# Patient Record
Sex: Male | Born: 1975 | Race: White | Hispanic: No | Marital: Married | Smoking: Never smoker
Health system: Southern US, Community
[De-identification: ages and names within clinical notes are randomized; demographics above are authoritative.]

## PROBLEM LIST (undated history)

## (undated) DIAGNOSIS — E349 Endocrine disorder, unspecified: Secondary | ICD-10-CM

## (undated) HISTORY — PX: OTHER SURGICAL HISTORY: SHX169

---

## 2016-07-06 ENCOUNTER — Other Ambulatory Visit (HOSPITAL_BASED_OUTPATIENT_CLINIC_OR_DEPARTMENT_OTHER): Payer: Self-pay

## 2016-07-06 DIAGNOSIS — R5383 Other fatigue: Secondary | ICD-10-CM

## 2016-07-06 DIAGNOSIS — R0683 Snoring: Secondary | ICD-10-CM

## 2016-07-06 DIAGNOSIS — R635 Abnormal weight gain: Secondary | ICD-10-CM

## 2016-07-06 DIAGNOSIS — G4710 Hypersomnia, unspecified: Secondary | ICD-10-CM

## 2016-08-04 ENCOUNTER — Encounter (HOSPITAL_BASED_OUTPATIENT_CLINIC_OR_DEPARTMENT_OTHER): Payer: Self-pay

## 2016-09-05 ENCOUNTER — Ambulatory Visit (HOSPITAL_BASED_OUTPATIENT_CLINIC_OR_DEPARTMENT_OTHER): Payer: BC Managed Care – PPO | Attending: Emergency Medicine | Admitting: Internal Medicine

## 2016-09-05 DIAGNOSIS — G4733 Obstructive sleep apnea (adult) (pediatric): Secondary | ICD-10-CM | POA: Diagnosis not present

## 2016-09-05 DIAGNOSIS — R0683 Snoring: Secondary | ICD-10-CM | POA: Diagnosis present

## 2016-09-05 DIAGNOSIS — R5383 Other fatigue: Secondary | ICD-10-CM | POA: Insufficient documentation

## 2016-09-05 DIAGNOSIS — R635 Abnormal weight gain: Secondary | ICD-10-CM | POA: Insufficient documentation

## 2016-09-05 DIAGNOSIS — G4710 Hypersomnia, unspecified: Secondary | ICD-10-CM

## 2016-09-09 DIAGNOSIS — R0683 Snoring: Secondary | ICD-10-CM

## 2016-09-09 NOTE — Procedures (Signed)
  Patient Name: Allen Noble, [name] Study Date: 09/05/2016 Gender: Male D.O.B: Mar 12, 1975 Age (years): 40 Referring Provider: Idalia Needleiane Miller Height (inches): 72 Interpreting Physician: Jetty Duhamellinton Lovell Nuttall MD, ABSM Weight (lbs): 285 RPSGT: Wylie HailDavis, Rico BMI: 39 MRN: [id number] Neck Size: 18.00 CLINICAL INFORMATION Sleep Study Type: NPSG  Indication for sleep study: Fatigue, Snoring  Epworth Sleepiness Score: 14  SLEEP STUDY TECHNIQUE As per the AASM Manual for the Scoring of Sleep and Associated Events v2.3 (April 2016) with a hypopnea requiring 4% desaturations.  The channels recorded and monitored were frontal, central and occipital EEG, electrooculogram (EOG), submentalis EMG (chin), nasal and oral airflow, thoracic and abdominal wall motion, anterior tibialis EMG, snore microphone, electrocardiogram, and pulse oximetry.  MEDICATIONS Medications self-administered by patient taken the night of the study : none reported  SLEEP ARCHITECTURE The study was initiated at 10:35:54 PM and ended at 4:38:10 AM.  Sleep onset time was 3.6 minutes and the sleep efficiency was 93.6%. The total sleep time was 339.2 minutes.  Stage REM latency was 160.5 minutes.  The patient spent 10.17% of the night in stage N1 sleep, 71.34% in stage N2 sleep, 6.49% in stage N3 and 12.00% in REM.  Alpha intrusion was absent.  Supine sleep was 47.96%.  RESPIRATORY PARAMETERS The overall apnea/hypopnea index (AHI) was 6.0 per hour. There were 15 total apneas, including 14 obstructive, 1 central and 0 mixed apneas. There were 19 hypopneas and 23 RERAs.  The AHI during Stage REM sleep was 35.4 per hour.  AHI while supine was 12.2 per hour.  The mean oxygen saturation was 93.49%. The minimum SpO2 during sleep was 82.00%.  Loud snoring was noted during this study.  CARDIAC DATA The 2 lead EKG demonstrated sinus rhythm. The mean heart rate was 58.98 beats per minute. Other EKG findings include: None.  LEG  MOVEMENT DATA The total PLMS were 0 with a resulting PLMS index of 0.00. Associated arousal with leg movement index was 0.0 .  IMPRESSIONS - Mild obstructive sleep apnea occurred during this study (AHI = 6.0/h). REM AHI 35.4/ hr. - No significant central sleep apnea occurred during this study (CAI = 0.2/h). - Mild oxygen desaturation was noted during this study (Min O2 = 82.00%, Mean 93.5%). - The patient snored with Loud snoring volume. - No cardiac abnormalities were noted during this study. - Clinically significant periodic limb movements did not occur during sleep. No significant associated arousals.  DIAGNOSIS - Obstructive Sleep Apnea (327.23 [G47.33 ICD-10])  RECOMMENDATIONS - Minimal obstructive sleep apnea may respond adequately to symptomatic therapy directed at snoring. Consider chin strap, oral appliance, management of nasal congestion. - Avoid alcohol, sedatives and other CNS depressants that may worsen sleep apnea and disrupt normal sleep architecture. - Sleep hygiene should be reviewed to assess factors that may improve sleep quality. - Weight management and regular exercise should be initiated or continued if appropriate.  [Electronically signed] 09/09/2016 08:38 PM  Jetty Duhamellinton Jametta Moorehead MD, ABSM Diplomate, American Board of Sleep Medicine   NPI: [id number]  Waymon BudgeYOUNG,Leshonda Galambos D Diplomate, American Board of Sleep Medicine  ELECTRONICALLY SIGNED ON:  09/09/2016, 8:33 PM [facility] PH: [phone]   FX: [phone] ACCREDITED BY THE AMERICAN ACADEMY OF SLEEP MEDICINE

## 2016-11-16 ENCOUNTER — Encounter: Payer: Self-pay | Admitting: Family Medicine

## 2016-11-16 ENCOUNTER — Ambulatory Visit (INDEPENDENT_AMBULATORY_CARE_PROVIDER_SITE_OTHER): Payer: BC Managed Care – PPO | Admitting: Family Medicine

## 2016-11-16 DIAGNOSIS — M25551 Pain in right hip: Secondary | ICD-10-CM

## 2016-11-16 MED ORDER — PREDNISONE 10 MG PO TABS: ORAL_TABLET | ORAL | 0 refills | Status: DC

## 2016-11-16 NOTE — Patient Instructions (Addendum)
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  Try to avoid painful activities when possible. Pick 2-3 stretches where you feel the pull in the area of pain - do 3 of these and hold for 20-30 seconds twice a day (ok to wait a couple days to start these if too painful). Prednisone dose pack as directed for 6 days. Do not take meloxicam(mobic) while on prednisone but ok to restart this the day after you finish the prednisone. Ok to take tramadol and flexeril as needed in addition to the prednisone. Consider physical therapy if improving, imaging of your lumbar spine if not improving. Call me in a week to let me know how you're doing.

## 2016-11-17 ENCOUNTER — Telehealth: Payer: Self-pay | Admitting: Family Medicine

## 2016-11-17 MED ORDER — OXYCODONE-ACETAMINOPHEN 5-325 MG PO TABS: 1.0000 | ORAL_TABLET | ORAL | 0 refills | Status: DC | PRN

## 2016-11-17 NOTE — Telephone Encounter (Signed)
I would expect him to be turning the corner in the next 24 hours.  We could try something stronger than tramadol like hydrocodone or oxycodone if he would like - please let me know.  If he still struggles despite the medications we will go ahead with MRI.  If he is really hurting over the weekend even with medications (including the stronger pain medicine) I'd encourage him to go to the emergency department.  Regardless I want him to call me again on Monday to let me know how he's doing.

## 2016-11-17 NOTE — Telephone Encounter (Signed)
Ok I sent oxycodone to his pharmacy.  Thanks!

## 2016-11-17 NOTE — Telephone Encounter (Signed)
Patient's wife calling with concerns. States patient is in extreme pain and can hardly sit or sleep.   Wants to know if this is normal for his dx and how long it takes for prednisone to start relieving pain. States at [facility] they prescribed him Tramadol and Flexeril for pain. The Tramadol did not relieve pain so patient started taking Ibuprofen which did help. Patient states he was instructed not to take Ibuprofen with Prednisone. Wants to know how he can get some relief

## 2016-11-17 NOTE — Telephone Encounter (Signed)
Informed patient and patient's wife. He would like a stronger medication to help with the pain.

## 2016-11-19 ENCOUNTER — Emergency Department (HOSPITAL_COMMUNITY)
Admission: EM | Admit: 2016-11-19 | Discharge: 2016-11-19 | Disposition: A | Discharge status: Home / Self Care | Payer: BC Managed Care – PPO | Attending: Emergency Medicine | Admitting: Emergency Medicine

## 2016-11-19 ENCOUNTER — Encounter (HOSPITAL_COMMUNITY): Payer: Self-pay

## 2016-11-19 ENCOUNTER — Other Ambulatory Visit: Payer: Self-pay

## 2016-11-19 DIAGNOSIS — M545 Low back pain: Secondary | ICD-10-CM | POA: Diagnosis present

## 2016-11-19 DIAGNOSIS — M5410 Radiculopathy, site unspecified: Secondary | ICD-10-CM

## 2016-11-19 MED ORDER — DIAZEPAM 5 MG/ML IJ SOLN
5.0000 mg | Freq: Once | INTRAMUSCULAR | Status: AC
  Administered 2016-11-19: 5 mg via INTRAMUSCULAR
  Filled 2016-11-19: qty 2

## 2016-11-19 MED ORDER — KETOROLAC TROMETHAMINE 30 MG/ML IJ SOLN
30.0000 mg | Freq: Once | INTRAMUSCULAR | Status: AC
  Administered 2016-11-19: 30 mg via INTRAMUSCULAR
  Filled 2016-11-19: qty 1

## 2016-11-19 NOTE — ED Triage Notes (Signed)
Pt states that he hurt his hip on Tuesday, seen at [facility] and diagnosed with sciatica, pain meds and muscle relaxer not relieving pain. Pain to R hip.

## 2016-11-19 NOTE — ED Notes (Signed)
Pt understood dc material. NAD Noted 

## 2016-11-19 NOTE — ED Provider Notes (Signed)
[facility] Provider Note   CSN: [id number] Arrival date & time: 11/19/16  [id number]     History   Chief Complaint Chief Complaint  Patient presents with  . Sciatica    HPI Allen Noble is a 41 y.o. male.  Patient presents to the ED with a chief complaint of low back and leg pain.  He states he was lifting weights and doing squats last week and began having pain in his leg and back.  He was seen by sports medicine, and was prescribed pain meds, muscle relaxants and prednisone for sciatica.  He denies any bowel or bladder incontinence, saddle anesthesia, weakness, numbness, or tingling.  He states that the pain is worsened with hip flexion.  He denies any other associated symptoms.   The history is provided by the patient. No language interpreter was used.    History reviewed. No pertinent past medical history.  There are no active problems to display for this patient.   History reviewed. No pertinent surgical history.     Home Medications    Prior to Admission medications   Medication Sig Start Date End Date Taking? Authorizing Provider  oxyCODONE-acetaminophen (PERCOCET/ROXICET) 5-325 MG tablet Take 1 tablet every 4 (four) hours as needed by mouth for severe pain. 11/17/16   Hudnall, Azucena FallenShane R, MD  predniSONE (DELTASONE) 10 MG tablet 6 tabs po day 1, 5 tabs po day 2, 4 tabs po day 3, 3 tabs po day 4, 2 tabs po day 5, 1 tab po day 6 11/16/16   Hudnall, Azucena FallenShane R, MD    Family History No family history on file.  Social History Social History   Tobacco Use  . Smoking status: Never Smoker  . Smokeless tobacco: Never Used  Substance Use Topics  . Alcohol use: No    Frequency: Never  . Drug use: No     Allergies   Patient has no known allergies.   Review of Systems Review of Systems  All other systems reviewed and are negative.    Physical Exam Updated Vital Signs BP (!) 138/97 (BP Location: Right Arm)   Pulse 99  Comment: Simultaneous filing. User may not have seen previous data.  Temp 97.7 F (36.5 C) (Oral)   Resp 17   SpO2 96% Comment: Simultaneous filing. User may not have seen previous data.  Physical Exam Physical Exam  Constitutional: Pt appears well-developed and well-nourished. No distress.  HENT:  Head: Normocephalic and atraumatic.  Mouth/Throat: Oropharynx is clear and moist. No oropharyngeal exudate.  Eyes: Conjunctivae are normal.  Neck: Normal range of motion. Neck supple.  No meningismus Cardiovascular: Normal rate, regular rhythm and intact distal pulses.   Pulmonary/Chest: Effort normal and breath sounds normal. No respiratory distress. Pt has no wheezes.  Abdominal: Pt exhibits no distension Musculoskeletal:  Mild right lumbar paraspinal muscle tenderness, no bony CTLS spine tenderness, deformity, step-off, or crepitus Lymphadenopathy: Pt has no cervical adenopathy.  Neurological: Pt is alert and oriented Speech is clear and goal oriented, follows commands Normal 5/5 strength in upper and lower extremities bilaterally including dorsiflexion and plantar flexion, strong and equal grip strength Sensation intact Moves extremities without ataxia, coordination intactl  Normal gait Normal balance No Clonus Skin: Skin is warm and dry. No rash noted. Pt is not diaphoretic. No erythema.  Psychiatric: Pt has a normal mood and affect. Behavior is normal.  Nursing note and vitals reviewed.   ED Treatments / Results  Labs (all labs ordered are  listed, but only abnormal results are displayed) Labs Reviewed - No data to display  EKG  EKG Interpretation None       Radiology No results found.  Procedures Procedures (including critical care time)  Medications Ordered in ED Medications  ketorolac (TORADOL) 30 MG/ML injection 30 mg (not administered)  diazepam (VALIUM) injection 5 mg (not administered)     Initial Impression / Assessment and Plan / ED Course  I have  reviewed the triage vital signs and the nursing notes.  Pertinent labs & imaging results that were available during my care of the patient were reviewed by me and considered in my medical decision making (see chart for details).     Patient with back pain.    No neurological deficits and normal neuro exam.  Patient is ambulatory.  No loss of bowel or bladder control.  Doubt cauda equina.  Denies fever,  doubt epidural abscess or other lesion. Recommend back exercises, stretching, RICE, and will recommend continuing therapy.  Consultants: none  Encouraged the patient that there could be a need for additional workup and/or imaging such as MRI, if the symptoms do not resolve. Patient advised that if the back pain does not resolve, or radiates, this could progress to more serious conditions and is encouraged to follow-up with PCP or orthopedics within 2 weeks.     Final Clinical Impressions(s) / ED Diagnoses   Final diagnoses:  Radicular pain of right lower extremity    ED Discharge Orders    None       Roxy HorsemanBrowning, Ileene Allie, PA-C 11/19/16 Ok [id number]    Wickline, Donald, MD 11/19/16 [phone]

## 2016-11-20 NOTE — Telephone Encounter (Signed)
Patient was informed on 11/16 of medication being sent to pharmacy

## 2016-11-21 ENCOUNTER — Encounter: Payer: Self-pay | Admitting: Family Medicine

## 2016-11-21 DIAGNOSIS — M25551 Pain in right hip: Secondary | ICD-10-CM | POA: Insufficient documentation

## 2016-11-21 NOTE — Progress Notes (Signed)
PCP: Jarrett SohoWharton, Courtney, PA-C  Subjective:   HPI: Patient is a 41 y.o. male here for right back/leg pain.  Patient reports he's had worsening right hip/leg pain for about 2 days now. Pain level is 5/10, feels like a cramp that goes down his right leg. Worse with prolonged sitting. Goes down leg laterally to about the knee. No numbness or tingling. No bowel/bladder dysfunction. Tried flexeril, tramadol, meloxicam from urgent care. Using heating pad also.  History reviewed. No pertinent past medical history.  Current Outpatient Medications on File Prior to Visit  Medication Sig Dispense Refill  . anastrozole (ARIMIDEX) 1 MG tablet Take 1 mg by mouth daily.  3  . PARoxetine (PAXIL) 10 MG tablet Take 10 mg by mouth daily.  3  . Testosterone 20.25 MG/ACT (1.62%) GEL APP 1 PUMP TO EACH UPPER ARM AND SHOULDER QAM UTD  3   No current facility-administered medications on file prior to visit.     History reviewed. No pertinent surgical history.  No Known Allergies  Social History   Socioeconomic History  . Marital status: Married    Spouse name: Not on file  . Number of children: Not on file  . Years of education: Not on file  . Highest education level: Not on file  Social Needs  . Financial resource strain: Not on file  . Food insecurity - worry: Not on file  . Food insecurity - inability: Not on file  . Transportation needs - medical: Not on file  . Transportation needs - non-medical: Not on file  Occupational History  . Not on file  Tobacco Use  . Smoking status: Never Smoker  . Smokeless tobacco: Never Used  Substance and Sexual Activity  . Alcohol use: No    Frequency: Never  . Drug use: No  . Sexual activity: Not on file  Other Topics Concern  . Not on file  Social History Narrative  . Not on file    History reviewed. No pertinent family history.  BP (!) 143/89   Pulse (!) 105   Ht 6' (1.829 m)   Wt 290 lb (131.5 kg)   BMI 39.33 kg/m   Review of  Systems: See HPI above.     Objective:  Physical Exam:  Gen: NAD, comfortable in exam room  Back: No gross deformity, scoliosis. TTP right hip over external rotators.  No midline or bony TTP. FROM. Strength LEs 5/5 all muscle groups.   1+ MSRs in patellar and achilles tendons, equal bilaterally. Negative SLRs. Sensation intact to light touch bilaterally.  Right hip: No gross deformity. TTP over hip external rotators.  No other tenderness. 5/5 strength all motions. Negative logroll bilateral hips Negative fabers and piriformis stretches. NVI distally.  Assessment & Plan:  1. Right hip pain - consistent with piriformis syndrome with sciatica.  Discussed possibility of disc herniation causing similar symptoms as well.  Shown home exercises and stretches to do.  Start prednisone dose pack then transition to meloxicam.  Tramadol and flexeril if needed.  Consider physical therapy, MRI lumbar spine if not improving.  Call us in a week with an update on his status.

## 2016-11-21 NOTE — Assessment & Plan Note (Signed)
consistent with piriformis syndrome with sciatica.  Discussed possibility of disc herniation causing similar symptoms as well.  Shown home exercises and stretches to do.  Start prednisone dose pack then transition to meloxicam.  Tramadol and flexeril if needed.  Consider physical therapy, MRI lumbar spine if not improving.  Call us in a week with an update on his status.

## 2016-11-22 ENCOUNTER — Ambulatory Visit: Payer: BC Managed Care – PPO | Admitting: Family Medicine

## 2016-12-09 ENCOUNTER — Encounter (HOSPITAL_COMMUNITY): Payer: Self-pay | Admitting: Emergency Medicine

## 2016-12-09 ENCOUNTER — Emergency Department (HOSPITAL_COMMUNITY)
Admission: EM | Admit: 2016-12-09 | Discharge: 2016-12-09 | Disposition: A | Discharge status: Home / Self Care | Payer: BC Managed Care – PPO | Attending: Emergency Medicine | Admitting: Emergency Medicine

## 2016-12-09 ENCOUNTER — Emergency Department (HOSPITAL_COMMUNITY): Payer: BC Managed Care – PPO

## 2016-12-09 DIAGNOSIS — N200 Calculus of kidney: Secondary | ICD-10-CM

## 2016-12-09 DIAGNOSIS — R1084 Generalized abdominal pain: Secondary | ICD-10-CM | POA: Diagnosis present

## 2016-12-09 DIAGNOSIS — Z79899 Other long term (current) drug therapy: Secondary | ICD-10-CM | POA: Insufficient documentation

## 2016-12-09 LAB — URINALYSIS, ROUTINE W REFLEX MICROSCOPIC
BILIRUBIN URINE: NEGATIVE
GLUCOSE, UA: NEGATIVE mg/dL
KETONES UR: NEGATIVE mg/dL
LEUKOCYTES UA: NEGATIVE
NITRITE: NEGATIVE
PROTEIN: NEGATIVE mg/dL
Specific Gravity, Urine: 1.019 (ref 1.005–1.030)
Squamous Epithelial / LPF: NONE SEEN
pH: 6 (ref 5.0–8.0)

## 2016-12-09 LAB — BASIC METABOLIC PANEL
Anion gap: 11 (ref 5–15)
BUN: 19 mg/dL (ref 6–20)
CALCIUM: 8.9 mg/dL (ref 8.9–10.3)
CO2: 19 mmol/L — ABNORMAL LOW (ref 22–32)
CREATININE: 1.1 mg/dL (ref 0.61–1.24)
Chloride: 105 mmol/L (ref 101–111)
GFR calc Af Amer: >60 mL/min (ref >60)
GLUCOSE: 107 mg/dL — AB (ref 65–99)
Potassium: 4.9 mmol/L (ref 3.5–5.1)
SODIUM: 135 mmol/L (ref 135–145)

## 2016-12-09 LAB — CBC
HEMATOCRIT: 44.2 % (ref 39.0–52.0)
Hemoglobin: 15.2 g/dL (ref 13.0–17.0)
MCH: 30.4 pg (ref 26.0–34.0)
MCHC: 34.4 g/dL (ref 30.0–36.0)
MCV: 88.4 fL (ref 78.0–100.0)
PLATELETS: 163 10*3/uL (ref 150–400)
RBC: 5 MIL/uL (ref 4.22–5.81)
RDW: 13.4 % (ref 11.5–15.5)
WBC: 12 10*3/uL — AB (ref 4.0–10.5)

## 2016-12-09 MED ORDER — TAMSULOSIN HCL 0.4 MG PO CAPS
0.4000 mg | ORAL_CAPSULE | Freq: Once | ORAL | Status: AC
  Administered 2016-12-09: 0.4 mg via ORAL
  Filled 2016-12-09: qty 1

## 2016-12-09 MED ORDER — OXYCODONE-ACETAMINOPHEN 5-325 MG PO TABS: 2.0000 | ORAL_TABLET | ORAL | 0 refills | Status: DC | PRN

## 2016-12-09 MED ORDER — TAMSULOSIN HCL 0.4 MG PO CAPS: 0.4000 mg | ORAL_CAPSULE | Freq: Every day | ORAL | 0 refills | Status: DC

## 2016-12-09 MED ORDER — KETOROLAC TROMETHAMINE 30 MG/ML IJ SOLN
30.0000 mg | Freq: Once | INTRAMUSCULAR | Status: AC
  Administered 2016-12-09: 30 mg via INTRAVENOUS
  Filled 2016-12-09: qty 1

## 2016-12-09 MED ORDER — OXYCODONE-ACETAMINOPHEN 5-325 MG PO TABS
1.0000 | ORAL_TABLET | Freq: Once | ORAL | Status: AC
  Administered 2016-12-09: 1 via ORAL
  Filled 2016-12-09: qty 1

## 2016-12-09 MED ORDER — HYDROMORPHONE HCL 1 MG/ML IJ SOLN: 1.0000 mg | INTRAMUSCULAR | Status: DC | PRN

## 2016-12-09 MED ORDER — PROMETHAZINE HCL 25 MG PO TABS: 25.0000 mg | ORAL_TABLET | Freq: Four times a day (QID) | ORAL | 0 refills | Status: DC | PRN

## 2016-12-09 MED ORDER — ONDANSETRON HCL 4 MG/2ML IJ SOLN
4.0000 mg | Freq: Once | INTRAMUSCULAR | Status: AC
  Administered 2016-12-09: 4 mg via INTRAVENOUS
  Filled 2016-12-09: qty 2

## 2016-12-09 NOTE — ED Notes (Signed)
Given strainer, and urine cup incase he passes stone.

## 2016-12-09 NOTE — ED Provider Notes (Signed)
[facility] EMERGENC[facility] Provider Note   CSN: [id number] Arrival date & time: 12/09/16  1037     History   Chief Complaint Chief Complaint  Patient presents with  . Flank Pain    HPI Allen Noble is a 41 y.o. male. Chief complaint is abdominal, and flank pain.  HPI: Allen Noble is a 41 year old male with no past history of kidney stones. He had a sudden onset of left flank and mid abdominal pain last night. Episode of nausea and vomiting 1. He became sweaty at home with recurrence of his symptoms this morning and has had recurrent episodes of colic keep in the bone area since last night. His urine was dark yesterday morning. No history of gout, or kidney stones for here family members. No fevers.  History reviewed. No pertinent past medical history.  Patient Active Problem List   Diagnosis Date Noted  . Right hip pain 11/21/2016    No past surgical history on file.     Home Medications    Prior to Admission medications   Medication Sig Start Date End Date Taking? Authorizing Provider  anastrozole (ARIMIDEX) 1 MG tablet Take 1 mg by mouth daily. 10/01/16   [provider]  oxyCODONE-acetaminophen (PERCOCET/ROXICET) 5-325 MG tablet Take 2 tablets by mouth every 4 (four) hours as needed. 12/09/16   Rolland PorterJames, Dvante Hands, MD  PARoxetine (PAXIL) 10 MG tablet Take 10 mg by mouth daily. 09/28/16   [provider]  predniSONE (DELTASONE) 10 MG tablet 6 tabs po day 1, 5 tabs po day 2, 4 tabs po day 3, 3 tabs po day 4, 2 tabs po day 5, 1 tab po day 6 11/16/16   Hudnall, Azucena FallenShane R, MD  promethazine (PHENERGAN) 25 MG tablet Take 1 tablet (25 mg total) by mouth every 6 (six) hours as needed for nausea or vomiting. 12/09/16   Rolland PorterJames, Javen Ridings, MD  tamsulosin (FLOMAX) 0.4 MG CAPS capsule Take 1 capsule (0.4 mg total) by mouth daily. 12/09/16   Rolland PorterJames, Eddye Broxterman, MD  Testosterone 20.25 MG/ACT (1.62%) GEL APP 1 PUMP TO EACH UPPER ARM AND SHOULDER QAM UTD 11/13/16   [provider]    Family History No family history on file.  Social History Social History   Tobacco Use  . Smoking status: Never Smoker  . Smokeless tobacco: Never Used  Substance Use Topics  . Alcohol use: No    Frequency: Never  . Drug use: No     Allergies   Patient has no known allergies.   Review of Systems Review of Systems  Constitutional: Negative for appetite change, chills, diaphoresis, fatigue and fever.  HENT: Negative for mouth sores, sore throat and trouble swallowing.   Eyes: Negative for visual disturbance.  Respiratory: Negative for cough, chest tightness, shortness of breath and wheezing.   Cardiovascular: Negative for chest pain.  Gastrointestinal: Positive for abdominal pain and diarrhea. Negative for abdominal distention, nausea and vomiting.  Endocrine: Negative for polydipsia, polyphagia and polyuria.  Genitourinary: Positive for flank pain. Negative for dysuria, frequency and hematuria.  Musculoskeletal: Negative for gait problem.  Skin: Negative for color change, pallor and rash.  Neurological: Negative for dizziness, syncope, light-headedness and headaches.  Hematological: Does not bruise/bleed easily.  Psychiatric/Behavioral: Negative for behavioral problems and confusion.     Physical Exam Updated Vital Signs BP (!) 150/90   Pulse 82   Temp 98.3 F (36.8 C) (Oral)   Resp 18   SpO2 95%   Physical Exam  Constitutional:  He is oriented to person, place, and time. He appears well-developed and well-nourished. No distress.  Patient pacing. Holding his left flank  HENT:  Head: Normocephalic.  Eyes: Conjunctivae are normal. Pupils are equal, round, and reactive to light. No scleral icterus.  Neck: Normal range of motion. Neck supple. No thyromegaly present.  Cardiovascular: Normal rate and regular rhythm. Exam reveals no gallop and no friction rub.  No murmur heard. Pulmonary/Chest: Effort normal and breath sounds normal. No respiratory  distress. He has no wheezes. He has no rales.  Abdominal: Soft. Bowel sounds are normal. He exhibits no distension. There is no tenderness. There is no rebound.  Soft benign abdomen. No reproducible tenderness.  Musculoskeletal: Normal range of motion.  Neurological: He is alert and oriented to person, place, and time.  Skin: Skin is warm and dry. No rash noted.  Psychiatric: He has a normal mood and affect. His behavior is normal.     ED Treatments / Results  Labs (all labs ordered are listed, but only abnormal results are displayed) Labs Reviewed  URINALYSIS, ROUTINE W REFLEX MICROSCOPIC - Abnormal; Notable for the following components:      Result Value   Hgb urine dipstick LARGE (*)    Bacteria, UA RARE (*)    All other components within normal limits  BASIC METABOLIC PANEL - Abnormal; Notable for the following components:   CO2 19 (*)    Glucose, Bld 107 (*)    All other components within normal limits  CBC - Abnormal; Notable for the following components:   WBC 12.0 (*)    All other components within normal limits  CBC    EKG  EKG Interpretation None       Radiology No results found.  Procedures Procedures (including critical care time)  Medications Ordered in ED Medications  HYDROmorphone (DILAUDID) injection 1 mg (not administered)  oxyCODONE-acetaminophen (PERCOCET/ROXICET) 5-325 MG per tablet 1 tablet (not administered)  ketorolac (TORADOL) 30 MG/ML injection 30 mg (30 mg Intravenous Given 12/09/16 1159)  ondansetron (ZOFRAN) injection 4 mg (4 mg Intravenous Given 12/09/16 1157)  tamsulosin (FLOMAX) capsule 0.4 mg (0.4 mg Oral Given 12/09/16 1200)     Initial Impression / Assessment and Plan / ED Course  I have reviewed the triage vital signs and the nursing notes.  Pertinent labs & imaging results that were available during my care of the patient were reviewed by me and considered in my medical decision making (see chart for details).  Clinical Course  as of Dec 09 1320  Sat Dec 09, 2016  1321 CBC [MJ]    Clinical Course User Index [MJ] Rolland PorterJames, Caddie Randle, MD   CT scan shows left mid ureteral stone with hydronephrosis. Urine shows blood but no infection. His renal function is intact. His given Toradol, Flomax, Zofran, and IV fluids and feeling much improved. Given by mouth Percocet 1. Discharge home. Daily Flomax.  Anti-inflammatories. When necessary oxycodone. When necessary Phenergan. Urology follow-up if not improving. Return precautions including intolerance to pain, refractory vomiting, fever.  Final Clinical Impressions(s) / ED Diagnoses   Final diagnoses:  Kidney stone    ED Discharge Orders        Ordered    oxyCODONE-acetaminophen (PERCOCET/ROXICET) 5-325 MG tablet  Every 4 hours PRN     12/09/16 1319    promethazine (PHENERGAN) 25 MG tablet  Every 6 hours PRN     12/09/16 1319    tamsulosin (FLOMAX) 0.4 MG CAPS capsule  Daily  12/09/16 1319       Rolland Porter, MD 12/09/16 1325

## 2016-12-09 NOTE — ED Notes (Signed)
Patient transported to CT 

## 2016-12-09 NOTE — Discharge Instructions (Signed)
Push fluids. Continue naproxen twice per day, or ibuprofen 3 times per day. Phenergan as needed for nausea. Percocet/oxycodone prescription as needed for pain not improving with above. Flomax once per day to expedite passage of stone Contact [facility] for 5 days if your symptoms have not improved/resolved. Return to ER with intolerance of symptoms, refractory vomiting or pain, fevers.

## 2016-12-09 NOTE — ED Triage Notes (Signed)
Pt diaphoretic. States new onset left sided abdominal/flank pain starting yesterday with nausea.

## 2017-02-01 ENCOUNTER — Encounter: Payer: Self-pay | Admitting: Family Medicine

## 2017-02-01 ENCOUNTER — Ambulatory Visit: Payer: BC Managed Care – PPO | Admitting: Family Medicine

## 2017-02-01 ENCOUNTER — Ambulatory Visit (HOSPITAL_BASED_OUTPATIENT_CLINIC_OR_DEPARTMENT_OTHER)
Admission: RE | Admit: 2017-02-01 | Discharge: 2017-02-01 | Disposition: A | Discharge status: Home / Self Care | Payer: BC Managed Care – PPO | Source: Ambulatory Visit | Attending: Family Medicine | Admitting: Family Medicine

## 2017-02-01 VITALS — BP 147/91 | HR 68 | Ht 72.0 in | Wt 282.0 lb

## 2017-02-01 DIAGNOSIS — M79604 Pain in right leg: Secondary | ICD-10-CM

## 2017-02-01 DIAGNOSIS — M25551 Pain in right hip: Secondary | ICD-10-CM

## 2017-02-01 DIAGNOSIS — M48061 Spinal stenosis, lumbar region without neurogenic claudication: Secondary | ICD-10-CM | POA: Diagnosis not present

## 2017-02-01 DIAGNOSIS — M545 Low back pain: Secondary | ICD-10-CM

## 2017-02-01 DIAGNOSIS — M5136 Other intervertebral disc degeneration, lumbar region: Secondary | ICD-10-CM | POA: Insufficient documentation

## 2017-02-01 MED ORDER — DICLOFENAC SODIUM 75 MG PO TBEC: 75.0000 mg | DELAYED_RELEASE_TABLET | Freq: Two times a day (BID) | ORAL | 1 refills | Status: DC

## 2017-02-01 NOTE — Patient Instructions (Addendum)
Your x-rays look good and are reassuring. You have lumbar radiculopathy (a pinched nerve in your low back) and synovitis or very mild arthritis of your hip. Ok to take tylenol for baseline pain relief (up to 1-2 extra strength tabs 3x/day) Voltaren 75mg  twice a day with food for pain and inflammation. Physical therapy has been shown to be helpful as well- start this and do home exercises on days you don't go to therapy. Strengthening of low back muscles, abdominal musculature are key for long term pain relief. If not improving, will consider further imaging (MRI). Follow up with me in 1 month.

## 2017-02-01 NOTE — Progress Notes (Signed)
PCP: Jarrett SohoWharton, Courtney, PA-C  Subjective:   HPI: Patient is a 42 y.o. male here for right back/leg pain.  11/16/16: Patient reports he's had worsening right hip/leg pain for about 2 days now. Pain level is 5/10, feels like a cramp that goes down his right leg. Worse with prolonged sitting. Goes down leg laterally to about the knee. No numbness or tingling. No bowel/bladder dysfunction. Tried flexeril, tramadol, meloxicam from urgent care. Using heating pad also.  02/01/17: Patient reports he's had problems since last visit here 2 months ago. Had kidney stone on left side that he finally passed after several weeks. Having right sided low back pain going ito right leg. Also feeling what feels like shin splints from knee to ankle and pain in right groin. Taking ibuprofen 800mg  a day. Doing low weight exercises at gym and no lower body work. Recalls leaning back changing a tire and felt a sharp pain going from back down the leg. No numbness or tingling though. Pain level 6/10, sharp. Taking oxycodone as needed rarely. No bowel/bladder dysfunction.  History reviewed. No pertinent past medical history.  Current Outpatient Medications on File Prior to Visit  Medication Sig Dispense Refill  . anastrozole (ARIMIDEX) 1 MG tablet Take 1 mg by mouth daily.  3  . oxyCODONE-acetaminophen (PERCOCET/ROXICET) 5-325 MG tablet Take 2 tablets by mouth every 4 (four) hours as needed. 6 tablet 0  . PARoxetine (PAXIL) 10 MG tablet Take 10 mg by mouth daily.  3  . predniSONE (DELTASONE) 10 MG tablet 6 tabs po day 1, 5 tabs po day 2, 4 tabs po day 3, 3 tabs po day 4, 2 tabs po day 5, 1 tab po day 6 21 tablet 0  . promethazine (PHENERGAN) 25 MG tablet Take 1 tablet (25 mg total) by mouth every 6 (six) hours as needed for nausea or vomiting. 10 tablet 0  . tamsulosin (FLOMAX) 0.4 MG CAPS capsule Take 1 capsule (0.4 mg total) by mouth daily. 7 capsule 0  . Testosterone 20.25 MG/ACT (1.62%) GEL APP 1 PUMP  TO EACH UPPER ARM AND SHOULDER QAM UTD  3   No current facility-administered medications on file prior to visit.     History reviewed. No pertinent surgical history.  No Known Allergies  Social History   Socioeconomic History  . Marital status: Married    Spouse name: Not on file  . Number of children: Not on file  . Years of education: Not on file  . Highest education level: Not on file  Social Needs  . Financial resource strain: Not on file  . Food insecurity - worry: Not on file  . Food insecurity - inability: Not on file  . Transportation needs - medical: Not on file  . Transportation needs - non-medical: Not on file  Occupational History  . Not on file  Tobacco Use  . Smoking status: Never Smoker  . Smokeless tobacco: Never Used  Substance and Sexual Activity  . Alcohol use: No    Frequency: Never  . Drug use: No  . Sexual activity: Not on file  Other Topics Concern  . Not on file  Social History Narrative  . Not on file    History reviewed. No pertinent family history.  BP (!) 147/91   Pulse 68   Ht 6' (1.829 m)   Wt 282 lb (127.9 kg)   BMI 38.25 kg/m   Review of Systems: See HPI above.     Objective:  Physical Exam:  Gen: NAD, comfortable in exam room.  Back: No gross deformity, scoliosis. TTP right lumbar paraspinal region.  No midline or bony TTP. FROM. Strength LEs 5-/5 right hip flexion, knee flexion/extension.  5/5 all other muscle groups. 1+ MSRs in patellar and achilles tendons, equal bilaterally. Positive SLR mildly on right, negative left. Sensation intact to light touch bilaterally.  Right hip: No deformity. No TTP. Minimal limitation IR but painful.   5-/5 strength hip flexion.  5/5 strength other hip motions. Positive logroll. Negative fabers and piriformis.  Assessment & Plan:  1. Right hip pain - Independently reviewed radiographs only noting mild degenerative changes of lumbar spine.  No evidence AVN of the hip.  Unusual  that he has signs/symptoms of both intraarticular hip pathology (likely mild arthritis or synovitis) and lumbar radiculopathy.  We discussed options - he would like to try voltaren plus physical therapy.  F/u in 1 month.  Consider MRI if not improving.

## 2017-02-04 ENCOUNTER — Encounter: Payer: Self-pay | Admitting: Family Medicine

## 2017-02-04 NOTE — Assessment & Plan Note (Signed)
Independently reviewed radiographs only noting mild degenerative changes of lumbar spine.  No evidence AVN of the hip.  Unusual that he has signs/symptoms of both intraarticular hip pathology (likely mild arthritis or synovitis) and lumbar radiculopathy.  We discussed options - he would like to try voltaren plus physical therapy.  F/u in 1 month.  Consider MRI if not improving.

## 2017-03-01 ENCOUNTER — Ambulatory Visit: Payer: BC Managed Care – PPO | Admitting: Family Medicine

## 2019-02-01 ENCOUNTER — Other Ambulatory Visit: Payer: Self-pay | Admitting: Gastroenterology

## 2019-02-01 DIAGNOSIS — R945 Abnormal results of liver function studies: Secondary | ICD-10-CM

## 2019-02-01 DIAGNOSIS — R7989 Other specified abnormal findings of blood chemistry: Secondary | ICD-10-CM

## 2019-08-03 ENCOUNTER — Emergency Department (HOSPITAL_COMMUNITY): Payer: BC Managed Care – PPO

## 2019-08-03 ENCOUNTER — Encounter (HOSPITAL_COMMUNITY): Payer: Self-pay | Admitting: Emergency Medicine

## 2019-08-03 ENCOUNTER — Emergency Department (HOSPITAL_COMMUNITY)
Admission: EM | Admit: 2019-08-03 | Discharge: 2019-08-03 | Disposition: A | Discharge status: Home / Self Care | Payer: BC Managed Care – PPO | Attending: Emergency Medicine | Admitting: Emergency Medicine

## 2019-08-03 ENCOUNTER — Other Ambulatory Visit: Payer: Self-pay

## 2019-08-03 DIAGNOSIS — U071 COVID-19: Secondary | ICD-10-CM | POA: Diagnosis not present

## 2019-08-03 DIAGNOSIS — R05 Cough: Secondary | ICD-10-CM | POA: Diagnosis present

## 2019-08-03 LAB — CBC WITH DIFFERENTIAL/PLATELET
Abs Immature Granulocytes: 0.04 10*3/uL (ref 0.00–0.07)
Basophils Absolute: 0 10*3/uL (ref 0.0–0.1)
Basophils Relative: 0 %
Eosinophils Absolute: 0 10*3/uL (ref 0.0–0.5)
Eosinophils Relative: 0 %
HCT: 44.8 % (ref 39.0–52.0)
Hemoglobin: 15.1 g/dL (ref 13.0–17.0)
Immature Granulocytes: 1 %
Lymphocytes Relative: 29 %
Lymphs Abs: 1.8 10*3/uL (ref 0.7–4.0)
MCH: 30.5 pg (ref 26.0–34.0)
MCHC: 33.7 g/dL (ref 30.0–36.0)
MCV: 90.5 fL (ref 80.0–100.0)
Monocytes Absolute: 0.4 10*3/uL (ref 0.1–1.0)
Monocytes Relative: 7 %
Neutro Abs: 4 10*3/uL (ref 1.7–7.7)
Neutrophils Relative %: 63 %
Platelets: 105 10*3/uL — ABNORMAL LOW (ref 150–400)
RBC: 4.95 MIL/uL (ref 4.22–5.81)
RDW: 14 % (ref 11.5–15.5)
WBC: 6.3 10*3/uL (ref 4.0–10.5)
nRBC: 0 % (ref 0.0–0.2)

## 2019-08-03 LAB — D-DIMER, QUANTITATIVE: D-Dimer, Quant: 0.45 ug/mL-FEU (ref 0.00–0.50)

## 2019-08-03 LAB — COMPREHENSIVE METABOLIC PANEL
ALT: 41 U/L (ref 0–44)
AST: 43 U/L — ABNORMAL HIGH (ref 15–41)
Albumin: 4.3 g/dL (ref 3.5–5.0)
Alkaline Phosphatase: 45 U/L (ref 38–126)
Anion gap: 11 (ref 5–15)
BUN: 15 mg/dL (ref 6–20)
CO2: 23 mmol/L (ref 22–32)
Calcium: 8.8 mg/dL — ABNORMAL LOW (ref 8.9–10.3)
Chloride: 103 mmol/L (ref 98–111)
Creatinine, Ser: 1.03 mg/dL (ref 0.61–1.24)
GFR calc Af Amer: >60 mL/min (ref >60)
GFR calc non Af Amer: >60 mL/min (ref >60)
Glucose, Bld: 120 mg/dL — ABNORMAL HIGH (ref 70–99)
Potassium: 4 mmol/L (ref 3.5–5.1)
Sodium: 137 mmol/L (ref 135–145)
Total Bilirubin: 0.7 mg/dL (ref 0.3–1.2)
Total Protein: 7.6 g/dL (ref 6.5–8.1)

## 2019-08-03 LAB — SARS CORONAVIRUS 2 BY RT PCR (HOSPITAL ORDER, PERFORMED IN ~~LOC~~ HOSPITAL LAB): SARS Coronavirus 2: POSITIVE — AB

## 2019-08-03 LAB — LACTIC ACID, PLASMA: Lactic Acid, Venous: 1.1 mmol/L (ref 0.5–1.9)

## 2019-08-03 MED ORDER — ONDANSETRON 4 MG PO TBDP: 4.0000 mg | ORAL_TABLET | Freq: Three times a day (TID) | ORAL | 0 refills | Status: DC | PRN

## 2019-08-03 NOTE — Discharge Instructions (Addendum)
You were evaluated in the Emergency Department and after careful evaluation, we did not find any emergent condition requiring admission or further testing in the hospital.  Your exam/testing today is overall reassuring.  Your symptoms seem to be due to the coronavirus.  We encourage you to continue use of Tylenol 1000 mg every 4-6 hours as well as Motrin 600 mg every 4-6 hours for your symptoms.  We are providing you a prescription for Zofran which can be used for nausea and help you to be able to drink plenty of fluids at home.  We expect you will be feeling better within the next few days.  Please return to the Emergency Department if you experience any worsening of your condition.   Thank you for allowing [place] to be a part of your care.

## 2019-08-03 NOTE — ED Provider Notes (Signed)
[facility]-EMERGENCY DEPT [facility] Emergency Department Provider Note MRN:  [id number]  Arrival date & time: 08/03/19     Chief Complaint   Covid Exposure   History of Present Illness   Allen Noble is a 44 y.o. year-old male with no pertinent past medical history presenting to the ED with chief complaint of Covid exposure.  Patient has recently tested positive.  Was exposed to his mother, who recently passed away from the coronavirus.  He has been having symptoms for 10 days, including cough, fever, body aches, malaise, fatigue.  Stood up and nearly passed out yesterday, fell onto the couch, no injuries.  Here for evaluation.  Denies chest pain or shortness of breath.  Review of Systems  A complete 10 system review of systems was obtained and all systems are negative except as noted in the HPI and PMH.   Patient's Health History   History reviewed. No pertinent past medical history.  History reviewed. No pertinent surgical history.  History reviewed. No pertinent family history.  Social History   Socioeconomic History  . Marital status: Married    Spouse name: Not on file  . Number of children: Not on file  . Years of education: Not on file  . Highest education level: Not on file  Occupational History  . Not on file  Tobacco Use  . Smoking status: Never Smoker  . Smokeless tobacco: Never Used  Substance and Sexual Activity  . Alcohol use: No  . Drug use: No  . Sexual activity: Not on file  Other Topics Concern  . Not on file  Social History Narrative  . Not on file   Social Determinants of Health   Financial Resource Strain:   . Difficulty of Paying Living Expenses:   Food Insecurity:   . Worried About Programme researcher, broadcasting/film/video in the Last Year:   . Barista in the Last Year:   Transportation Needs:   . Freight forwarder (Medical):   Marland Kitchen Lack of Transportation (Non-Medical):   Physical Activity:   . Days of Exercise per Week:   . Minutes of Exercise  per Session:   Stress:   . Feeling of Stress :   Social Connections:   . Frequency of Communication with Friends and Family:   . Frequency of Social Gatherings with Friends and Family:   . Attends Religious Services:   . Active Member of Clubs or Organizations:   . Attends Banker Meetings:   Marland Kitchen Marital Status:   Intimate Partner Violence:   . Fear of Current or Ex-Partner:   . Emotionally Abused:   Marland Kitchen Physically Abused:   . Sexually Abused:      Physical Exam   Vitals:   08/03/19 0612 08/03/19 0807  BP: 125/85 (!) 143/93  Pulse: 102 99  Resp: 19 18  Temp: 98.2 F (36.8 C) 98.2 F (36.8 C)  SpO2: 96% 93%    CONSTITUTIONAL: Well-appearing, NAD NEURO:  Alert and oriented x 3, no focal deficits EYES:  eyes equal and reactive ENT/NECK:  no LAD, no JVD CARDIO: Regular rate, well-perfused, normal S1 and S2 PULM:  CTAB no wheezing or rhonchi GI/GU:  normal bowel sounds, non-distended, non-tender MSK/SPINE:  No gross deformities, no edema SKIN:  no rash, atraumatic PSYCH:  Appropriate speech and behavior  *Additional and/or pertinent findings included in MDM below  Diagnostic and Interventional Summary    EKG Interpretation  Date/Time:  Sunday August 03 2019 07:50:45 EDT Ventricular Rate:  94 PR Interval:    QRS Duration: 91 QT Interval:  340 QTC Calculation: 426 R Axis:   38 Text Interpretation: Sinus rhythm Baseline wander in lead(s) III 12 Lead; Mason-Likar No previous ECGs available Confirmed by Kennis Carina [phone]) on 08/03/2019 8:14:21 AM      Labs Reviewed  COMPREHENSIVE METABOLIC PANEL - Abnormal; Notable for the following components:      Result Value   Glucose, Bld 120 (*)    Calcium 8.8 (*)    AST 43 (*)    All other components within normal limits  CBC WITH DIFFERENTIAL/PLATELET - Abnormal; Notable for the following components:   Platelets 105 (*)    All other components within normal limits  SARS CORONAVIRUS 2 BY RT PCR (HOSPITAL  ORDER, PERFORMED IN  HOSPITAL LAB)  CULTURE, BLOOD (ROUTINE X 2)  LACTIC ACID, PLASMA  D-DIMER, QUANTITATIVE (NOT AT [facility])    DG Chest Portable 1 View  Final Result      Medications - No data to display   Procedures  /  Critical Care Procedures  ED Course and Medical Decision Making  I have reviewed the triage vital signs, the nursing notes, and pertinent available records from the EMR.  Listed above are laboratory and imaging tests that I personally ordered, reviewed, and interpreted and then considered in my medical decision making (see below for details).      Patient is on day 10 of symptoms of COVID-19, reassuring vital signs, no hypoxia, no increased work of breathing, clear lungs, triage work-up is reassuring with negative D-dimer, labs, chest x-ray.  EKG is reassuring with normal intervals, patient's near syncopal episode likely due to mild dehydration.  Reassurance provided, advised more fluids at home.  Allen Noble was evaluated in Emergency Department on 08/03/2019 for the symptoms described in the history of present illness. He was evaluated in the context of the global COVID-19 pandemic, which necessitated consideration that the patient might be at risk for infection with the SARS-CoV-2 virus that causes COVID-19. Institutional protocols and algorithms that pertain to the evaluation of patients at risk for COVID-19 are in a state of rapid change based on information released by regulatory bodies including the CDC and federal and state organizations. These policies and algorithms were followed during the patient's care in the ED.   Allen Noble. Pilar Plate, MD [facility] Emergency Medicine [facility] [email or web address]  Final Clinical Impressions(s) / ED Diagnoses     ICD-10-CM   1. COVID-19  U07.1     ED Discharge Orders         Ordered    ondansetron (ZOFRAN ODT) 4 MG disintegrating tablet  Every 8 hours PRN     Discontinue  Reprint      08/03/19 0819           Discharge Instructions Discussed with and Provided to Patient:     Discharge Instructions     You were evaluated in the Emergency Department and after careful evaluation, we did not find any emergent condition requiring admission or further testing in the hospital.  Your exam/testing today is overall reassuring.  Your symptoms seem to be due to the coronavirus.  We encourage you to continue use of Tylenol 1000 mg every 4-6 hours as well as Motrin 600 mg every 4-6 hours for your symptoms.  We are providing you a prescription for Zofran which can be used for nausea and help you to be able to drink plenty of fluids at  home.  We expect you will be feeling better within the next few days.  Please return to the Emergency Department if you experience any worsening of your condition.   Thank you for allowing [place] to be a part of your care.      Sabas Sous, MD 08/03/19 [phone]

## 2019-08-03 NOTE — ED Triage Notes (Signed)
Patient states he passed out last night. He tested positive for covid with home test on 05-17-22. Mother died of covin on 2019/08/20. Patient states that he can not shake the fever. Patient took tylenol around 4 pm.

## 2019-08-08 ENCOUNTER — Inpatient Hospital Stay
Admission: EM | Admit: 2019-08-08 | Discharge: 2019-08-12 | DRG: 177 | Disposition: A | Discharge status: Home / Self Care | Payer: BC Managed Care – PPO | Attending: Internal Medicine | Admitting: Internal Medicine

## 2019-08-08 ENCOUNTER — Other Ambulatory Visit: Payer: Self-pay

## 2019-08-08 ENCOUNTER — Emergency Department: Payer: BC Managed Care – PPO

## 2019-08-08 DIAGNOSIS — U071 COVID-19: Principal | ICD-10-CM

## 2019-08-08 DIAGNOSIS — E669 Obesity, unspecified: Secondary | ICD-10-CM | POA: Diagnosis not present

## 2019-08-08 DIAGNOSIS — Z79811 Long term (current) use of aromatase inhibitors: Secondary | ICD-10-CM

## 2019-08-08 DIAGNOSIS — Z791 Long term (current) use of non-steroidal anti-inflammatories (NSAID): Secondary | ICD-10-CM | POA: Diagnosis not present

## 2019-08-08 DIAGNOSIS — E291 Testicular hypofunction: Secondary | ICD-10-CM | POA: Diagnosis present

## 2019-08-08 DIAGNOSIS — Z6839 Body mass index (BMI) 39.0-39.9, adult: Secondary | ICD-10-CM | POA: Diagnosis not present

## 2019-08-08 DIAGNOSIS — R0902 Hypoxemia: Secondary | ICD-10-CM

## 2019-08-08 DIAGNOSIS — Z79899 Other long term (current) drug therapy: Secondary | ICD-10-CM

## 2019-08-08 DIAGNOSIS — J9601 Acute respiratory failure with hypoxia: Secondary | ICD-10-CM | POA: Diagnosis present

## 2019-08-08 DIAGNOSIS — J1282 Pneumonia due to coronavirus disease 2019: Secondary | ICD-10-CM | POA: Diagnosis present

## 2019-08-08 HISTORY — DX: Endocrine disorder, unspecified: E34.9

## 2019-08-08 LAB — CBC WITH DIFFERENTIAL/PLATELET
Abs Immature Granulocytes: 0.09 10*3/uL — ABNORMAL HIGH (ref 0.00–0.07)
Basophils Absolute: 0 10*3/uL (ref 0.0–0.1)
Basophils Relative: 0 %
Eosinophils Absolute: 0 10*3/uL (ref 0.0–0.5)
Eosinophils Relative: 0 %
HCT: 38.7 % — ABNORMAL LOW (ref 39.0–52.0)
Hemoglobin: 13.3 g/dL (ref 13.0–17.0)
Immature Granulocytes: 1 %
Lymphocytes Relative: 23 %
Lymphs Abs: 1.6 10*3/uL (ref 0.7–4.0)
MCH: 30.6 pg (ref 26.0–34.0)
MCHC: 34.4 g/dL (ref 30.0–36.0)
MCV: 89.2 fL (ref 80.0–100.0)
Monocytes Absolute: 0.6 10*3/uL (ref 0.1–1.0)
Monocytes Relative: 9 %
Neutro Abs: 4.7 10*3/uL (ref 1.7–7.7)
Neutrophils Relative %: 67 %
Platelets: 196 10*3/uL (ref 150–400)
RBC: 4.34 MIL/uL (ref 4.22–5.81)
RDW: 14.3 % (ref 11.5–15.5)
WBC: 7.1 10*3/uL (ref 4.0–10.5)
nRBC: 0 % (ref 0.0–0.2)

## 2019-08-08 LAB — COMPREHENSIVE METABOLIC PANEL
ALT: 36 U/L (ref 0–44)
AST: 38 U/L (ref 15–41)
Albumin: 3.6 g/dL (ref 3.5–5.0)
Alkaline Phosphatase: 38 U/L (ref 38–126)
Anion gap: 9 (ref 5–15)
BUN: 14 mg/dL (ref 6–20)
CO2: 27 mmol/L (ref 22–32)
Calcium: 8.5 mg/dL — ABNORMAL LOW (ref 8.9–10.3)
Chloride: 101 mmol/L (ref 98–111)
Creatinine, Ser: 0.99 mg/dL (ref 0.61–1.24)
GFR calc Af Amer: >60 mL/min (ref >60)
GFR calc non Af Amer: >60 mL/min (ref >60)
Glucose, Bld: 103 mg/dL — ABNORMAL HIGH (ref 70–99)
Potassium: 4.3 mmol/L (ref 3.5–5.1)
Sodium: 137 mmol/L (ref 135–145)
Total Bilirubin: 0.7 mg/dL (ref 0.3–1.2)
Total Protein: 7.2 g/dL (ref 6.5–8.1)

## 2019-08-08 LAB — ABO/RH: ABO/RH(D): O POS

## 2019-08-08 LAB — TRIGLYCERIDES: Triglycerides: 131 mg/dL (ref <150)

## 2019-08-08 LAB — LACTATE DEHYDROGENASE: LDH: 298 U/L — ABNORMAL HIGH (ref 98–192)

## 2019-08-08 LAB — LACTIC ACID, PLASMA: Lactic Acid, Venous: 1.1 mmol/L (ref 0.5–1.9)

## 2019-08-08 LAB — CULTURE, BLOOD (ROUTINE X 2)
Culture: NO GROWTH
Special Requests: ADEQUATE

## 2019-08-08 LAB — C-REACTIVE PROTEIN: CRP: 6.6 mg/dL — ABNORMAL HIGH (ref <1.0)

## 2019-08-08 LAB — FIBRIN DERIVATIVES D-DIMER (ARMC ONLY): Fibrin derivatives D-dimer (ARMC): 855.5 ng/mL (FEU) — ABNORMAL HIGH (ref 0.00–499.00)

## 2019-08-08 LAB — PROCALCITONIN: Procalcitonin: <0.1 ng/mL

## 2019-08-08 LAB — FERRITIN: Ferritin: 521 ng/mL — ABNORMAL HIGH (ref 24–336)

## 2019-08-08 LAB — FIBRINOGEN: Fibrinogen: 700 mg/dL — ABNORMAL HIGH (ref 210–475)

## 2019-08-08 MED ORDER — ENOXAPARIN SODIUM 40 MG/0.4ML ~~LOC~~ SOLN
40.0000 mg | SUBCUTANEOUS | Status: DC
  Administered 2019-08-09 – 2019-08-11 (×4): 40 mg via SUBCUTANEOUS
  Filled 2019-08-08 (×5): qty 0.4

## 2019-08-08 MED ORDER — ONDANSETRON HCL 4 MG PO TABS: 4.0000 mg | ORAL_TABLET | Freq: Four times a day (QID) | ORAL | Status: DC | PRN

## 2019-08-08 MED ORDER — TESTOSTERONE 20.25 MG/ACT (1.62%) TD GEL: 2.0000 | TRANSDERMAL | Status: DC

## 2019-08-08 MED ORDER — SODIUM CHLORIDE 0.9 % IV SOLN
200.0000 mg | Freq: Once | INTRAVENOUS | Status: AC
  Administered 2019-08-09: 200 mg via INTRAVENOUS
  Filled 2019-08-08: qty 40

## 2019-08-08 MED ORDER — DEXAMETHASONE SODIUM PHOSPHATE 10 MG/ML IJ SOLN
6.0000 mg | Freq: Once | INTRAMUSCULAR | Status: AC
  Administered 2019-08-08: 6 mg via INTRAVENOUS
  Filled 2019-08-08: qty 1

## 2019-08-08 MED ORDER — ONDANSETRON HCL 4 MG/2ML IJ SOLN: 4.0000 mg | Freq: Four times a day (QID) | INTRAMUSCULAR | Status: DC | PRN

## 2019-08-08 MED ORDER — ACETAMINOPHEN 325 MG PO TABS
650.0000 mg | ORAL_TABLET | Freq: Four times a day (QID) | ORAL | Status: DC | PRN
  Administered 2019-08-09: 650 mg via ORAL
  Filled 2019-08-08: qty 2

## 2019-08-08 MED ORDER — DEXAMETHASONE SODIUM PHOSPHATE 10 MG/ML IJ SOLN
6.0000 mg | Freq: Every day | INTRAMUSCULAR | Status: DC
  Administered 2019-08-09 – 2019-08-12 (×4): 6 mg via INTRAVENOUS
  Filled 2019-08-08 (×4): qty 1

## 2019-08-08 MED ORDER — ALBUTEROL SULFATE HFA 108 (90 BASE) MCG/ACT IN AERS
1.0000 | INHALATION_SPRAY | RESPIRATORY_TRACT | Status: DC | PRN
  Administered 2019-08-09: 22:00:00 1 via RESPIRATORY_TRACT
  Filled 2019-08-08 (×2): qty 6.7

## 2019-08-08 MED ORDER — BENZONATATE 100 MG PO CAPS
100.0000 mg | ORAL_CAPSULE | Freq: Three times a day (TID) | ORAL | Status: DC | PRN
  Administered 2019-08-09 – 2019-08-11 (×3): 100 mg via ORAL
  Filled 2019-08-08 (×4): qty 1

## 2019-08-08 MED ORDER — SODIUM CHLORIDE 0.9 % IV SOLN: 100.0000 mg | Freq: Every day | INTRAVENOUS | Status: DC

## 2019-08-08 NOTE — ED Provider Notes (Signed)
[facility] Emergency Department Provider Note   ____________________________________________   First MD Initiated Contact with Patient 08/08/19 1431     (approximate)  I have reviewed the triage vital signs and the nursing notes.   HISTORY  Chief Complaint Shortness of Breath    HPI Allen Noble is a 44 y.o. male who was diagnosed with Covid several days ago.  He has become increasingly short of breath.  He is coughing up quarter size lumps of phlegm mostly clear over the last day or so they have been greenish.  Chest x-ray shows multifocal pneumonia consistent with Covid.         History reviewed. No pertinent past medical history.  Patient Active Problem List   Diagnosis Date Noted  . Right hip pain 11/21/2016    History reviewed. No pertinent surgical history.  Prior to Admission medications   Medication Sig Start Date End Date Taking? Authorizing Provider  anastrozole (ARIMIDEX) 1 MG tablet Take 1 mg by mouth daily. 10/01/16   [provider]  diclofenac (VOLTAREN) 75 MG EC tablet Take 1 tablet (75 mg total) by mouth 2 (two) times daily. 02/01/17   Hudnall, Azucena Fallen, MD  ondansetron (ZOFRAN ODT) 4 MG disintegrating tablet Take 1 tablet (4 mg total) by mouth every 8 (eight) hours as needed for nausea or vomiting. 08/03/19   Sabas Sous, MD  oxyCODONE-acetaminophen (PERCOCET/ROXICET) 5-325 MG tablet Take 2 tablets by mouth every 4 (four) hours as needed. 12/09/16   Rolland Porter, MD  PARoxetine (PAXIL) 10 MG tablet Take 10 mg by mouth daily. 09/28/16   [provider]  predniSONE (DELTASONE) 10 MG tablet 6 tabs po day 1, 5 tabs po day 2, 4 tabs po day 3, 3 tabs po day 4, 2 tabs po day 5, 1 tab po day 6 11/16/16   Hudnall, Azucena Fallen, MD  promethazine (PHENERGAN) 25 MG tablet Take 1 tablet (25 mg total) by mouth every 6 (six) hours as needed for nausea or vomiting. 12/09/16   Rolland Porter, MD  tamsulosin (FLOMAX) 0.4 MG CAPS capsule  Take 1 capsule (0.4 mg total) by mouth daily. 12/09/16   Rolland Porter, MD  Testosterone 20.25 MG/ACT (1.62%) GEL APP 1 PUMP TO EACH UPPER ARM AND SHOULDER QAM UTD 11/13/16   [provider]    Allergies Patient has no known allergies.  History reviewed. No pertinent family history.  Social History Social History   Tobacco Use  . Smoking status: Never Smoker  . Smokeless tobacco: Never Used  Substance Use Topics  . Alcohol use: No  . Drug use: No    Review of Systems  Constitutional: No fever/chills Eyes: No visual changes. ENT: No sore throat. Cardiovascular: Denies chest pain. Respiratory:shortness of breath. Gastrointestinal: No abdominal pain.  No nausea, no vomiting.  No diarrhea.  No constipation. Genitourinary: Negative for dysuria. Musculoskeletal: Negative for back pain. Skin: Negative for rash. Neurological: Negative for headaches, focal weakness   ____________________________________________   PHYSICAL EXAM:  VITAL SIGNS: ED Triage Vitals  Enc Vitals Group     BP 08/08/19 1412 129/88     Pulse Rate 08/08/19 1412 91     Resp 08/08/19 1412 (!) 23     Temp 08/08/19 1412 (!) 97.5 F (36.4 C)     Temp Source 08/08/19 1412 Oral     SpO2 08/08/19 1412 93 %     Weight 08/08/19 1413 292 lb (132.5 kg)     Height 08/08/19 1413  6' (1.829 m)     Head Circumference --      Peak Flow --      Pain Score 08/08/19 1413 0     Pain Loc --      Pain Edu? --      Excl. in GC? --     Constitutional: Alert and oriented. Well appearing and in no acute distress. Eyes: Conjunctivae are normal. PERRL. EOMI. Head: Atraumatic. Nose: No congestion/rhinnorhea. Mouth/Throat: Mucous membranes are moist.  Oropharynx non-erythematous. Neck: No stridor.   Cardiovascular: Normal rate, regular rhythm. Grossly normal heart sounds.  Good peripheral circulation. Respiratory: Normal respiratory effort.  No retractions. Lungs occasional scattered crackles Gastrointestinal: Soft  and nontender. No distention. No abdominal bruits.  Musculoskeletal: No lower extremity tenderness nor edema.  No joint effusions. Neurologic:  Normal speech and language. No gross focal neurologic deficits are appreciated.  Skin:  Skin is warm, dry and intact. No rash noted.   ____________________________________________   LABS (all labs ordered are listed, but only abnormal results are displayed)  Labs Reviewed  CBC WITH DIFFERENTIAL/PLATELET - Abnormal; Notable for the following components:      Result Value   HCT 38.7 (*)    Abs Immature Granulocytes 0.09 (*)    All other components within normal limits  COMPREHENSIVE METABOLIC PANEL - Abnormal; Notable for the following components:   Glucose, Bld 103 (*)    Calcium 8.5 (*)    All other components within normal limits  FIBRIN DERIVATIVES D-DIMER ([facility] ONLY) - Abnormal; Notable for the following components:   Fibrin derivatives D-dimer ([facility]) 855.50 (*)    All other components within normal limits  LACTATE DEHYDROGENASE - Abnormal; Notable for the following components:   LDH 298 (*)    All other components within normal limits  FERRITIN - Abnormal; Notable for the following components:   Ferritin 521 (*)    All other components within normal limits  FIBRINOGEN - Abnormal; Notable for the following components:   Fibrinogen 700 (*)    All other components within normal limits  CULTURE, BLOOD (ROUTINE X 2)  CULTURE, BLOOD (ROUTINE X 2)  LACTIC ACID, PLASMA  TRIGLYCERIDES  LACTIC ACID, PLASMA  PROCALCITONIN  C-REACTIVE PROTEIN   ____________________________________________  EKG  EKG read interpreted by me shows normal sinus rhythm rate of 82 normal axis essentially normal EKG ____________________________________________  RADIOLOGY  ED MD interpretation: X-ray read by radiology reviewed by me shows a bilateral patchy pneumonia consistent with Covid  Official radiology report(s): DG Chest Port 1 View  Result Date:  08/08/2019 CLINICAL DATA:  COVID. Additional history provided: Patient diagnosed with COVID 2 weeks ago, increased shortness of breath, confusion, oxygen saturations of 89%, dizziness, cough. EXAM: PORTABLE CHEST 1 VIEW COMPARISON:  Prior chest radiograph 08/03/2019. FINDINGS: Heart size within normal limits. New from the prior examination of 08/03/2019 there are fairly extensive bilateral interstitial and patchy airspace opacities throughout both lungs. No evidence of pleural effusion or pneumothorax. No acute bony abnormality identified. IMPRESSION: Fairly extensive bilateral interstitial and patchy airspace opacities throughout both lungs, new as compared to the prior examination of 08/03/2019. Findings are consistent with COVID pneumonia given the provided history. Electronically Signed   By: Jackey Loge DO   On: 08/08/2019 14:46    ____________________________________________   PROCEDURES  Procedure(s) performed (including Critical Care):  Procedures   ____________________________________________   INITIAL IMPRESSION / ASSESSMENT AND PLAN / ED COURSE  Patient looks fine lying on the bed on 2 L of  oxygen his O2 sats are 96% but immediately when he begins moving in the bed he becomes short of breath.  If I turn his oxygen down to 0 he desaturates to 84 within 1 minute          ____________________________________________   FINAL CLINICAL IMPRESSION(S) / ED DIAGNOSES  Final diagnoses:  Hypoxia  COVID-19 virus detected     ED Discharge Orders    None       Note:  This document was prepared using Dragon voice recognition software and may include unintentional dictation errors.    Arnaldo Natal, MD 08/08/19 [phone]

## 2019-08-08 NOTE — ED Notes (Signed)
Wife updated at this time states she feels like pt is "not being himself and seems sedated." Pt is alert, oriented to person, place, situation, knows year but not date. Pt states he's been out of work and the days have been running together. Pt reports periods of SOB and states that it hurts when he pees. Oxygen increased to 3 liter's, cell plugged in for pt. Pt states he "just feels relaxed." Pt reports mother died on the 2022/05/21.

## 2019-08-08 NOTE — H&P (Addendum)
History and Physical:    Allen Noble   PJA:[id number] DOB: [birth year] DOA: 08/08/2019  Referring MD/provider: Dorothea Glassman, MD PCP: Jarrett Soho, PA-C   Patient coming from: Home  Chief Complaint: Shortness of breath  History of Present Illness:   Allen Noble is a 44 y.o. male with medical history significant for testosterone deficiency, obesity, presented to the hospital because of increasing shortness of breath and confusion.  He said he was diagnosed with Covid infection about 2 weeks ago.  He has been having intermittent fever and chills at home. He went to [facility] emergency department on 08/03/2019 where he was treated and discharged on the same day.  At that time, he was not hypoxic and he did not have any increased work of breathing per chart review.  He has been having shortness of breath for the past for 5 days.  Shortness of breath is progressively worsened.  He also has a productive cough associated with pleuritic chest pain on both sides of the chest and pain radiating down both shoulders.  His oxygen saturation was 88 to 90% at home on pulse oximetry.  He said his wife told him he has been confused at times and occasionally stares into space.  He was advised to come to the hospital because of worsening symptoms.  No vomiting, abdominal pain, diarrhea, myalgia.  ED Course:  The patient was hypoxic with oxygen saturation going down to 80% on room air according to ED physician, Dr. Darnelle Catalan.  He was placed on 2 L/min oxygen via nasal cannula and oxygen saturation has improved into the 90s.  ROS:   ROS all other systems reviewed were negative.  Past Medical History:   Past Medical History:  Diagnosis Date  . Testosterone deficiency     Past Surgical History:   Past Surgical History:  Procedure Laterality Date  . none      Social History:   Social History   Socioeconomic History  . Marital status: Married    Spouse name: Not on file  .  Number of children: Not on file  . Years of education: Not on file  . Highest education level: Not on file  Occupational History  . Not on file  Tobacco Use  . Smoking status: Never Smoker  . Smokeless tobacco: Never Used  Substance and Sexual Activity  . Alcohol use: No  . Drug use: No  . Sexual activity: Not on file  Other Topics Concern  . Not on file  Social History Narrative  . Not on file   Social Determinants of Health   Financial Resource Strain:   . Difficulty of Paying Living Expenses:   Food Insecurity:   . Worried About Programme researcher, broadcasting/film/video in the Last Year:   . Barista in the Last Year:   Transportation Needs:   . Freight forwarder (Medical):   Marland Kitchen Lack of Transportation (Non-Medical):   Physical Activity:   . Days of Exercise per Week:   . Minutes of Exercise per Session:   Stress:   . Feeling of Stress :   Social Connections:   . Frequency of Communication with Friends and Family:   . Frequency of Social Gatherings with Friends and Family:   . Attends Religious Services:   . Active Member of Clubs or Organizations:   . Attends Banker Meetings:   Marland Kitchen Marital Status:   Intimate Partner Violence:   . Fear of Current or Ex-Partner:   .  Emotionally Abused:   Marland Kitchen Physically Abused:   . Sexually Abused:     Allergies   Patient has no known allergies.  Family history:   History reviewed.  His mother died from COVID-19 infection recently  Current Medications:   Prior to Admission medications   Medication Sig Start Date End Date Taking? Authorizing Provider  albuterol (VENTOLIN HFA) 108 (90 Base) MCG/ACT inhaler Inhale 1-2 puffs into the lungs every 4 (four) hours as needed for shortness of breath or wheezing. 08/01/19  Yes [provider]  benzonatate (TESSALON) 100 MG capsule Take 100 mg by mouth 3 (three) times daily as needed for cough. 08/04/19  Yes [provider]  [employer] CHEST CONGESTION RELIEF 400 MG TABS tablet  Take 400 mg by mouth 2 (two) times daily as needed for congestion. 08/04/19  Yes [provider]  ondansetron (ZOFRAN ODT) 4 MG disintegrating tablet Take 1 tablet (4 mg total) by mouth every 8 (eight) hours as needed for nausea or vomiting. 08/03/19  Yes Sabas Sous, MD  Testosterone 20.25 MG/ACT (1.62%) GEL Apply 2 Pump topically See admin instructions. Apply 1 pump to each upper arm topically every day   Yes [provider]    Physical Exam:   Vitals:   08/08/19 1414 08/08/19 1430 08/08/19 1459 08/08/19 1622  BP:  125/82    Pulse: 90 91 87 93  Resp:   (!) 21   Temp:      TempSrc:      SpO2: 91% 97% 97% 96%  Weight:      Height:         Physical Exam: Blood pressure 125/82, pulse 93, temperature (!) 97.5 F (36.4 C), temperature source Oral, resp. rate (!) 21, height 6' (1.829 m), weight 132.5 kg, SpO2 96 %. Gen: No acute distress. Head: Normocephalic, atraumatic. Eyes: Pupils equal, round and reactive to light. Extraocular movements intact.  Sclerae nonicteric.  Mouth: Moist mucous membranes Neck: Supple, no thyromegaly, no lymphadenopathy, no jugular venous distention. Chest: Lungs are clear to auscultation with good air movement. No rales, rhonchi or wheezes.  CV: Heart sounds are regular with an S1, S2. No murmurs, rubs or gallops. Abdomen: Soft, nontender, obese with normal active bowel sounds. No palpable masses. Extremities: Extremities are without clubbing, or cyanosis. No edema. Pedal pulses 2+.  Skin: Warm and dry. No rashes Neuro: Alert and oriented times 3; grossly nonfocal.  Psych: Insight is good and judgment is appropriate. Mood and affect normal.   Data Review:    Labs: Basic Metabolic Panel: Recent Labs  Lab 08/03/19 0617 08/08/19 1444  NA 137 137  K 4.0 4.3  CL 103 101  CO2 23 27  GLUCOSE 120* 103*  BUN 15 14  CREATININE 1.03 0.99  CALCIUM 8.8* 8.5*   Liver Function Tests: Recent Labs  Lab 08/03/19 0617 08/08/19 1444   AST 43* 38  ALT 41 36  ALKPHOS 45 38  BILITOT 0.7 0.7  PROT 7.6 7.2  ALBUMIN 4.3 3.6   No results for input(s): LIPASE, AMYLASE in the last 168 hours. No results for input(s): AMMONIA in the last 168 hours. CBC: Recent Labs  Lab 08/03/19 0617 08/08/19 1444  WBC 6.3 7.1  NEUTROABS 4.0 4.7  HGB 15.1 13.3  HCT 44.8 38.7*  MCV 90.5 89.2  PLT 105* 196   Cardiac Enzymes: No results for input(s): CKTOTAL, CKMB, CKMBINDEX, TROPONINI in the last 168 hours.  BNP (last 3 results) No results for input(s): PROBNP in  the last 8760 hours. CBG: No results for input(s): GLUCAP in the last 168 hours.  Urinalysis    Component Value Date/Time   COLORURINE YELLOW 12/09/2016 1040   APPEARANCEUR CLEAR 12/09/2016 1040   LABSPEC 1.019 12/09/2016 1040   PHURINE 6.0 12/09/2016 1040   GLUCOSEU NEGATIVE 12/09/2016 1040   HGBUR LARGE (A) 12/09/2016 1040   BILIRUBINUR NEGATIVE 12/09/2016 1040   KETONESUR NEGATIVE 12/09/2016 1040   PROTEINUR NEGATIVE 12/09/2016 1040   NITRITE NEGATIVE 12/09/2016 1040   LEUKOCYTESUR NEGATIVE 12/09/2016 1040      Radiographic Studies: DG Chest Port 1 View  Result Date: 08/08/2019 CLINICAL DATA:  COVID. Additional history provided: Patient diagnosed with COVID 2 weeks ago, increased shortness of breath, confusion, oxygen saturations of 89%, dizziness, cough. EXAM: PORTABLE CHEST 1 VIEW COMPARISON:  Prior chest radiograph 08/03/2019. FINDINGS: Heart size within normal limits. New from the prior examination of 08/03/2019 there are fairly extensive bilateral interstitial and patchy airspace opacities throughout both lungs. No evidence of pleural effusion or pneumothorax. No acute bony abnormality identified. IMPRESSION: Fairly extensive bilateral interstitial and patchy airspace opacities throughout both lungs, new as compared to the prior examination of 08/03/2019. Findings are consistent with COVID pneumonia given the provided history. Electronically Signed   By: Jackey Loge DO   On: 08/08/2019 14:46    EKG: Independently reviewed.  Normal sinus rhythm, no acute ST-T changes   Assessment/Plan:   Principal Problem:   Pneumonia due to COVID-19 virus Active Problems:   Acute respiratory failure with hypoxia (HCC)   Obesity, Class III, BMI 40-49.9 (morbid obesity) (HCC)   COVID-19 pneumonia: Admit to MedSurg and monitor on telemetry.  Treat with IV remdesivir infusion and IV dexamethasone.  Monitor inflammatory markers.  Acute hypoxemic respiratory failure: Treat with oxygen via nasal cannula and taper off oxygen as able.  Testosterone deficiency: Continue testosterone supplement   Body mass index is 39.6 kg/m.  (Morbid obesity): This complicates overall care and prognosis.  Other information:   DVT prophylaxis: Lovenox  Code Status: Full code. Family Communication: Plan discussed with patient Disposition Plan: Possible discharge to home in 3 to 5 days Consults called: None Admission status: Inpatient  The medical decision making on this patient was of high complexity and the patient is at high risk for clinical deterioration, therefore this is a level 3 visit.    Time spent 50 minutes  Suhas Estis [facility]   How to contact the [facility] Attending or Consulting provider 7A - 7P or covering provider during after hours 7P -7A, for this patient?   1. Check the care team in [facility] and look for a) attending/consulting [facility] provider listed and b) the [facility] team listed 2. Log into [email or web address] and use [place]'s universal password to access. If you do not have the password, please contact the hospital operator. 3. Locate the [facility] provider you are looking for under [facility] and page to a number that you can be directly reached. 4. If you still have difficulty reaching the provider, please page the [facility] (Director on Call) for the Hospitalists listed on amion for assistance.  08/08/2019, 4:59 PM

## 2019-08-08 NOTE — ED Notes (Signed)
Pt given ice water.

## 2019-08-08 NOTE — Consult Note (Signed)
Remdesivir - Pharmacy Brief Note   O:   ALT: WNL CXR: Fairly extensive bilateral interstitial and patchy airspace opacities throughout both lungs, new as compared to the prior examination of 08/03/2019. Findings are consistent with COVID pneumonia given the provided history SpO2:  placed on 2 L/min oxygen via nasal cannula and oxygen saturation has improved into the 90s.   A/P:   Remdesivir 200 mg IVPB once followed by 100 mg IVPB daily x 4 days  .me 08/08/2019 5:06 PM

## 2019-08-08 NOTE — ED Triage Notes (Signed)
Per ems, pt was dx with covid two weeks ago and has had increased SOB, confusion, and oxygen sats of 89% and CO2 33-35 on RA. Pt c/o sob, dizziness, and cough, denies CP, N/V/D. Pt is AOX4, NAD. Respirations unlabored at rest, lung sound clear bilaterally. Oxygen 93% on RA.

## 2019-08-09 ENCOUNTER — Inpatient Hospital Stay: Payer: BC Managed Care – PPO

## 2019-08-09 DIAGNOSIS — E669 Obesity, unspecified: Secondary | ICD-10-CM

## 2019-08-09 LAB — CBC WITH DIFFERENTIAL/PLATELET
Abs Immature Granulocytes: 0.16 10*3/uL — ABNORMAL HIGH (ref 0.00–0.07)
Basophils Absolute: 0 10*3/uL (ref 0.0–0.1)
Basophils Relative: 0 %
Eosinophils Absolute: 0 10*3/uL (ref 0.0–0.5)
Eosinophils Relative: 0 %
HCT: 39.3 % (ref 39.0–52.0)
Hemoglobin: 13.1 g/dL (ref 13.0–17.0)
Immature Granulocytes: 2 %
Lymphocytes Relative: 19 %
Lymphs Abs: 1.3 10*3/uL (ref 0.7–4.0)
MCH: 30 pg (ref 26.0–34.0)
MCHC: 33.3 g/dL (ref 30.0–36.0)
MCV: 89.9 fL (ref 80.0–100.0)
Monocytes Absolute: 0.2 10*3/uL (ref 0.1–1.0)
Monocytes Relative: 2 %
Neutro Abs: 5.2 10*3/uL (ref 1.7–7.7)
Neutrophils Relative %: 77 %
Platelets: 250 10*3/uL (ref 150–400)
RBC: 4.37 MIL/uL (ref 4.22–5.81)
RDW: 13.7 % (ref 11.5–15.5)
Smear Review: NORMAL
WBC: 6.8 10*3/uL (ref 4.0–10.5)
nRBC: 0 % (ref 0.0–0.2)

## 2019-08-09 LAB — COMPREHENSIVE METABOLIC PANEL
ALT: 40 U/L (ref 0–44)
AST: 35 U/L (ref 15–41)
Albumin: 3.5 g/dL (ref 3.5–5.0)
Alkaline Phosphatase: 45 U/L (ref 38–126)
Anion gap: 12 (ref 5–15)
BUN: 17 mg/dL (ref 6–20)
CO2: 25 mmol/L (ref 22–32)
Calcium: 8.8 mg/dL — ABNORMAL LOW (ref 8.9–10.3)
Chloride: 101 mmol/L (ref 98–111)
Creatinine, Ser: 0.82 mg/dL (ref 0.61–1.24)
GFR calc Af Amer: >60 mL/min (ref >60)
GFR calc non Af Amer: >60 mL/min (ref >60)
Glucose, Bld: 177 mg/dL — ABNORMAL HIGH (ref 70–99)
Potassium: 4.3 mmol/L (ref 3.5–5.1)
Sodium: 138 mmol/L (ref 135–145)
Total Bilirubin: 1 mg/dL (ref 0.3–1.2)
Total Protein: 7.2 g/dL (ref 6.5–8.1)

## 2019-08-09 LAB — HIV ANTIBODY (ROUTINE TESTING W REFLEX): HIV Screen 4th Generation wRfx: NONREACTIVE

## 2019-08-09 LAB — FIBRIN DERIVATIVES D-DIMER (ARMC ONLY): Fibrin derivatives D-dimer (ARMC): 904.49 ng/mL (FEU) — ABNORMAL HIGH (ref 0.00–499.00)

## 2019-08-09 LAB — FERRITIN: Ferritin: 594 ng/mL — ABNORMAL HIGH (ref 24–336)

## 2019-08-09 LAB — C-REACTIVE PROTEIN: CRP: 4.8 mg/dL — ABNORMAL HIGH (ref <1.0)

## 2019-08-09 MED ORDER — SODIUM CHLORIDE 0.9 % IV SOLN
100.0000 mg | Freq: Every day | INTRAVENOUS | Status: AC
  Administered 2019-08-09 – 2019-08-12 (×4): 100 mg via INTRAVENOUS
  Filled 2019-08-09 (×2): qty 20
  Filled 2019-08-09 (×2): qty 100

## 2019-08-09 MED ORDER — IPRATROPIUM-ALBUTEROL 20-100 MCG/ACT IN AERS
1.0000 | INHALATION_SPRAY | Freq: Four times a day (QID) | RESPIRATORY_TRACT | Status: DC
  Administered 2019-08-09 – 2019-08-12 (×9): 1 via RESPIRATORY_TRACT
  Filled 2019-08-09: qty 4

## 2019-08-09 MED ORDER — ASCORBIC ACID 500 MG PO TABS
500.0000 mg | ORAL_TABLET | Freq: Two times a day (BID) | ORAL | Status: DC
  Administered 2019-08-09 – 2019-08-12 (×7): 500 mg via ORAL
  Filled 2019-08-09 (×7): qty 1

## 2019-08-09 MED ORDER — IOHEXOL 350 MG/ML SOLN
100.0000 mL | Freq: Once | INTRAVENOUS | Status: AC | PRN
  Administered 2019-08-09: 17:00:00 100 mL via INTRAVENOUS

## 2019-08-09 MED ORDER — ZINC SULFATE 220 (50 ZN) MG PO CAPS
220.0000 mg | ORAL_CAPSULE | Freq: Every day | ORAL | Status: DC
  Administered 2019-08-09 – 2019-08-12 (×4): 220 mg via ORAL
  Filled 2019-08-09 (×4): qty 1

## 2019-08-09 NOTE — Progress Notes (Signed)
Patient returned from CT in stable condition.

## 2019-08-09 NOTE — Plan of Care (Signed)
°  Problem: Education: Goal: Knowledge of risk factors and measures for prevention of condition will improve 08/09/2019 0313 by Sherlyn Lick, RN Outcome: Progressing 08/09/2019 0313 by Sherlyn Lick, RN Outcome: Progressing   Problem: Coping: Goal: Psychosocial and spiritual needs will be supported 08/09/2019 0313 by Sherlyn Lick, RN Outcome: Progressing 08/09/2019 0313 by Sherlyn Lick, RN Outcome: Progressing   Problem: Respiratory: Goal: Will maintain a patent airway 08/09/2019 0313 by Sherlyn Lick, RN Outcome: Progressing 08/09/2019 0313 by Sherlyn Lick, RN Outcome: Progressing Goal: Complications related to the disease process, condition or treatment will be avoided or minimized 08/09/2019 0313 by Sherlyn Lick, RN Outcome: Progressing 08/09/2019 0313 by Sherlyn Lick, RN Outcome: Progressing

## 2019-08-09 NOTE — Plan of Care (Signed)
  Problem: Education: Goal: Knowledge of risk factors and measures for prevention of condition will improve Outcome: Progressing   Problem: Coping: Goal: Psychosocial and spiritual needs will be supported Outcome: Progressing   Problem: Respiratory: Goal: Will maintain a patent airway Outcome: Progressing Goal: Complications related to the disease process, condition or treatment will be avoided or minimized Outcome: Progressing   

## 2019-08-09 NOTE — Progress Notes (Signed)
PROGRESS NOTE  Allen Noble IRC:[id number] DOB: 10/08/1975 DOA: 08/08/2019 PCP: Jarrett Soho, PA-C  HPI/Recap of past 24 hours: HPI from [name] is a 44 y.o. male with medical history significant for testosterone deficiency, obesity, presented to the hospital because of increasing shortness of breath and confusion.  He said he was diagnosed with Covid infection about 2 weeks ago.  He has been having intermittent fever and chills at home. He went to [facility] emergency department on 08/03/2019 where he was treated and discharged on the same day.  At that time, he was not hypoxic and he did not have any increased work of breathing per chart review. Pt continues to have productive cough associated with pleuritic chest pain on both sides of the chest and pain radiating down both shoulders.  His oxygen saturation was 88 to 90% at home on pulse oximetry.  He said his wife told him he has been confused at times and occasionally stares into space.  He was advised to come to the hospital because of worsening symptoms. In the ED, patient was hypoxic with oxygen saturation going down to 80% on room air according to ED physician, Dr. Darnelle Catalan. Pt placed on 2 L/min oxygen via nasal cannula and oxygen saturation has improved into the 90s.  Patient admitted for further management.    Today, patient continues to complain of shortness of breath, nonproductive cough.  Noted to require about 3 L of O2.  Some labored breathing upon ambulation noted.   Assessment/Plan: Principal Problem:   Pneumonia due to COVID-19 virus Active Problems:   Acute respiratory failure with hypoxia (HCC)   Obesity, Class III, BMI 40-49.9 (morbid obesity) (HCC)   Acute hypoxic respiratory failure likely 2/2 COVID-19 pneumonia Currently requiring about 3 L of O2 saturating above 90% Currently afebrile, with no leukocytosis Elevated inflammatory markers, will trend Procalcitonin negative Chest x-ray  showed fairly extensive bilateral interstitial and patchy airspace opacities throughout both lungs CTA chest pending Continue steroid, remdesivir, cough suppressants, inhalers, vitamins Encourage incentive spirometry, flutter valve Supplemental oxygen as needed, monitor closely   Obesity Lifestyle modification advised       Malnutrition Type:      Malnutrition Characteristics:      Nutrition Interventions:       Estimated body mass index is 39.6 kg/m as calculated from the following:   Height as of this encounter: 6' (1.829 m).   Weight as of this encounter: 132.5 kg.     Code Status: Full  Family Communication: Discussed with wife over the phone on 08/09/2019  Disposition Plan: Status is: Inpatient  Remains inpatient appropriate because:Inpatient level of care appropriate due to severity of illness   Dispo: The patient is from: Home              Anticipated d/c is to: Home              Anticipated d/c date is: 3 days              Patient currently is not medically stable to d/c.    Consultants:  None  Procedures:  None  Antimicrobials:  None  DVT prophylaxis: Lovenox   Objective: Vitals:   08/09/19 0748 08/09/19 1051 08/09/19 1054 08/09/19 1153  BP:    121/82  Pulse:    80  Resp:    (!) 22  Temp: 98.4 F (36.9 C)   97.7 F (36.5 C)  TempSrc: Oral   Oral  SpO2:  96% 94% 94%  Weight:      Height:        Intake/Output Summary (Last 24 hours) at 08/09/2019 1415 Last data filed at 08/09/2019 1356 Gross per 24 hour  Intake 769.29 ml  Output 1000 ml  Net -230.71 ml   Filed Weights   08/08/19 1413  Weight: 132.5 kg    Exam:  General: NAD   Cardiovascular: S1, S2 present  Respiratory: CTAB  Abdomen: Soft, nontender, nondistended, bowel sounds present  Musculoskeletal: No bilateral pedal edema noted  Skin: Normal  Psychiatry: Normal mood    Data Reviewed: CBC: Recent Labs  Lab 08/03/19 0617 08/08/19 1444  08/09/19 0959  WBC 6.3 7.1 6.8  NEUTROABS 4.0 4.7 5.2  HGB 15.1 13.3 13.1  HCT 44.8 38.7* 39.3  MCV 90.5 89.2 89.9  PLT 105* 196 250   Basic Metabolic Panel: Recent Labs  Lab 08/03/19 0617 08/08/19 1444 08/09/19 0959  NA 137 137 138  K 4.0 4.3 4.3  CL 103 101 101  CO2 23 27 25   GLUCOSE 120* 103* 177*  BUN 15 14 17   CREATININE 1.03 0.99 0.82  CALCIUM 8.8* 8.5* 8.8*   GFR: Estimated Creatinine Clearance: 163.6 mL/min (by C-G formula based on SCr of 0.82 mg/dL). Liver Function Tests: Recent Labs  Lab 08/03/19 0617 08/08/19 1444 08/09/19 0959  AST 43* 38 35  ALT 41 36 40  ALKPHOS 45 38 45  BILITOT 0.7 0.7 1.0  PROT 7.6 7.2 7.2  ALBUMIN 4.3 3.6 3.5   No results for input(s): LIPASE, AMYLASE in the last 168 hours. No results for input(s): AMMONIA in the last 168 hours. Coagulation Profile: No results for input(s): INR, PROTIME in the last 168 hours. Cardiac Enzymes: No results for input(s): CKTOTAL, CKMB, CKMBINDEX, TROPONINI in the last 168 hours. BNP (last 3 results) No results for input(s): PROBNP in the last 8760 hours. HbA1C: No results for input(s): HGBA1C in the last 72 hours. CBG: No results for input(s): GLUCAP in the last 168 hours. Lipid Profile: Recent Labs    08/08/19 1444  TRIG 131   Thyroid Function Tests: No results for input(s): TSH, T4TOTAL, FREET4, T3FREE, THYROIDAB in the last 72 hours. Anemia Panel: Recent Labs    08/08/19 1444 08/09/19 0959  FERRITIN 521* 594*   Urine analysis:    Component Value Date/Time   COLORURINE YELLOW 12/09/2016 1040   APPEARANCEUR CLEAR 12/09/2016 1040   LABSPEC 1.019 12/09/2016 1040   PHURINE 6.0 12/09/2016 1040   GLUCOSEU NEGATIVE 12/09/2016 1040   HGBUR LARGE (A) 12/09/2016 1040   BILIRUBINUR NEGATIVE 12/09/2016 1040   KETONESUR NEGATIVE 12/09/2016 1040   PROTEINUR NEGATIVE 12/09/2016 1040   NITRITE NEGATIVE 12/09/2016 1040   LEUKOCYTESUR NEGATIVE 12/09/2016 1040   Sepsis  Labs: @LABRCNTIP (procalcitonin:4,lacticidven:4)  ) Recent Results (from the past 240 hour(s))  SARS Coronavirus 2 by RT PCR (hospital order, [facility]) Nasopharyngeal Nasopharyngeal Swab     Status: Abnormal   Collection Time: 08/03/19  6:17 AM   Specimen: Nasopharyngeal Swab  Result Value Ref Range Status   SARS Coronavirus 2 POSITIVE (A) NEGATIVE Final    Comment: RESULT CALLED TO, READ BACK BY AND VERIFIED WITH: Lanette HampshireCLAPP, S RN [phone] 08/03/19 JM (NOTE) SARS-CoV-2 target nucleic acids are DETECTED  SARS-CoV-2 RNA is generally detectable in upper respiratory specimens  during the acute phase of infection.  Positive results are indicative  of the presence of the identified virus, but do not rule out bacterial infection  or co-infection with other pathogens not detected by the test.  Clinical correlation with patient history and  other diagnostic information is necessary to determine patient infection status.  The expected result is negative.  Fact Sheet for Patients:   BoilerBrush.com.cy   Fact Sheet for Healthcare Providers:   https://pope.com/    This test is not yet approved or cleared by the [place] FDA and  has been authorized for detection and/or diagnosis of SARS-CoV-2 by FDA under an Emergency Use Authorization (EUA).  This EUA will remain in effect (meaning this test can b e used) for the duration of  the COVID-19 declaration under Section 564(b)(1) of the Act, 21 U.S.C. section 360-bbb-3(b)(1), unless the authorization is terminated or revoked sooner.  Performed at [facility], [address], [place], [place] [address]   Blood culture (routine x 2)     Status: None   Collection Time: 08/03/19  6:26 AM   Specimen: BLOOD  Result Value Ref Range Status   Specimen Description   Final    BLOOD RIGHT ANTECUBITAL Performed at [facility], [address] [place], [place] [address]    Special Requests   Final    BOTTLES DRAWN AEROBIC AND ANAEROBIC Blood Culture adequate volume Performed at [facility], [address] [place], [place] [address]    Culture   Final    NO GROWTH 5 DAYS Performed at [facility], [address] [name], [place] [address]    Report Status 08/08/2019 FINAL  Final  Blood Culture (routine x 2)     Status: None (Preliminary result)   Collection Time: 08/08/19  2:39 PM   Specimen: BLOOD  Result Value Ref Range Status   Specimen Description BLOOD RIGHT ANTECUBITAL  Final   Special Requests   Final    BOTTLES DRAWN AEROBIC AND ANAEROBIC Blood Culture adequate volume   Culture   Final    NO GROWTH < 24 HOURS Performed at [facility], [address], [place], [place] [address]    Report Status PENDING  Incomplete  Blood Culture (routine x 2)     Status: None (Preliminary result)   Collection Time: 08/08/19  2:44 PM   Specimen: BLOOD  Result Value Ref Range Status   Specimen Description BLOOD LEFT ANTECUBITAL  Final   Special Requests   Final    BOTTLES DRAWN AEROBIC AND ANAEROBIC Blood Culture adequate volume   Culture   Final    NO GROWTH < 24 HOURS Performed at [facility], [address] [place], [place] [address]    Report Status PENDING  Incomplete      Studies: DG Chest Port 1 View  Result Date: 08/08/2019 CLINICAL DATA:  COVID. Additional history provided: Patient diagnosed with COVID 2 weeks ago, increased shortness of breath, confusion, oxygen saturations of 89%, dizziness, cough. EXAM: PORTABLE CHEST 1 VIEW COMPARISON:  Prior chest radiograph 08/03/2019. FINDINGS: Heart size within normal limits. New from the prior examination of 08/03/2019 there are fairly extensive bilateral interstitial and patchy airspace opacities throughout both lungs. No evidence of pleural effusion or pneumothorax. No acute bony abnormality identified. IMPRESSION: Fairly  extensive bilateral interstitial and patchy airspace opacities throughout both lungs, new as compared to the prior examination of 08/03/2019. Findings are consistent with COVID pneumonia given the provided history. Electronically Signed   By: Jackey Loge DO   On: 08/08/2019 14:46    Scheduled Meds: . dexamethasone (DECADRON) injection  6 mg Intravenous Daily  . enoxaparin (LOVENOX) injection  40 mg Subcutaneous Q24H  . Testosterone  2 Pump Topical See admin instructions    Continuous Infusions: . remdesivir 100 mg in NS 100 mL 100 mg (08/09/19 1356)     LOS: 1 day     Briant Cedar, MD [facility]  If 7PM-7AM, please contact night-coverage [email or web address] 08/09/2019, 2:15 PM

## 2019-08-09 NOTE — Progress Notes (Signed)
Patient ambulated to bathroom with 3L [place] in place. O2 sats dropped to 88% and labored breathing noted. Pt expressed feeling increased shortness of breath. O2 saturations recovered quickly to 94% 3L  with rest.

## 2019-08-09 NOTE — Progress Notes (Signed)
Pt off unit to CT in stable condition.

## 2019-08-10 LAB — CBC WITH DIFFERENTIAL/PLATELET
Abs Immature Granulocytes: 0.48 10*3/uL — ABNORMAL HIGH (ref 0.00–0.07)
Basophils Absolute: 0.1 10*3/uL (ref 0.0–0.1)
Basophils Relative: 0 %
Eosinophils Absolute: 0 10*3/uL (ref 0.0–0.5)
Eosinophils Relative: 0 %
HCT: 36.9 % — ABNORMAL LOW (ref 39.0–52.0)
Hemoglobin: 13.1 g/dL (ref 13.0–17.0)
Immature Granulocytes: 3 %
Lymphocytes Relative: 12 %
Lymphs Abs: 2.4 10*3/uL (ref 0.7–4.0)
MCH: 31.3 pg (ref 26.0–34.0)
MCHC: 35.5 g/dL (ref 30.0–36.0)
MCV: 88.1 fL (ref 80.0–100.0)
Monocytes Absolute: 1.2 10*3/uL — ABNORMAL HIGH (ref 0.1–1.0)
Monocytes Relative: 6 %
Neutro Abs: 15.3 10*3/uL — ABNORMAL HIGH (ref 1.7–7.7)
Neutrophils Relative %: 79 %
Platelets: 283 10*3/uL (ref 150–400)
RBC: 4.19 MIL/uL — ABNORMAL LOW (ref 4.22–5.81)
RDW: 13.6 % (ref 11.5–15.5)
WBC: 19.4 10*3/uL — ABNORMAL HIGH (ref 4.0–10.5)
nRBC: 0 % (ref 0.0–0.2)

## 2019-08-10 LAB — COMPREHENSIVE METABOLIC PANEL
ALT: 47 U/L — ABNORMAL HIGH (ref 0–44)
AST: 33 U/L (ref 15–41)
Albumin: 3.5 g/dL (ref 3.5–5.0)
Alkaline Phosphatase: 38 U/L (ref 38–126)
Anion gap: 9 (ref 5–15)
BUN: 20 mg/dL (ref 6–20)
CO2: 26 mmol/L (ref 22–32)
Calcium: 8.8 mg/dL — ABNORMAL LOW (ref 8.9–10.3)
Chloride: 101 mmol/L (ref 98–111)
Creatinine, Ser: 1.02 mg/dL (ref 0.61–1.24)
GFR calc Af Amer: >60 mL/min (ref >60)
GFR calc non Af Amer: >60 mL/min (ref >60)
Glucose, Bld: 140 mg/dL — ABNORMAL HIGH (ref 70–99)
Potassium: 4.3 mmol/L (ref 3.5–5.1)
Sodium: 136 mmol/L (ref 135–145)
Total Bilirubin: 0.7 mg/dL (ref 0.3–1.2)
Total Protein: 6.9 g/dL (ref 6.5–8.1)

## 2019-08-10 LAB — FERRITIN: Ferritin: 566 ng/mL — ABNORMAL HIGH (ref 24–336)

## 2019-08-10 LAB — C-REACTIVE PROTEIN: CRP: 2 mg/dL — ABNORMAL HIGH (ref <1.0)

## 2019-08-10 LAB — FIBRIN DERIVATIVES D-DIMER (ARMC ONLY): Fibrin derivatives D-dimer (ARMC): 695.76 ng/mL (FEU) — ABNORMAL HIGH (ref 0.00–499.00)

## 2019-08-10 MED ORDER — POLYVINYL ALCOHOL 1.4 % OP SOLN
1.0000 [drp] | OPHTHALMIC | Status: DC | PRN
  Administered 2019-08-10: 1 [drp] via OPHTHALMIC
  Filled 2019-08-10 (×2): qty 15

## 2019-08-10 NOTE — Progress Notes (Signed)
PROGRESS NOTE  Allen Noble ACZ:[id number] DOB: May 24, 1975 DOA: 08/08/2019 PCP: Allen Soho, PA-C  HPI/Recap of past 24 hours: HPI from Dr Allen Noble is a 44 y.o. male with medical history significant for testosterone deficiency, obesity, presented to the hospital because of increasing shortness of breath and confusion.  He said he was diagnosed with Covid infection about 2 weeks ago.  He has been having intermittent fever and chills at home. He went to [facility] emergency department on 08/03/2019 where he was treated and discharged on the same day.  At that time, he was not hypoxic and he did not have any increased work of breathing per chart review. Pt continues to have productive cough associated with pleuritic chest pain on both sides of the chest and pain radiating down both shoulders.  His oxygen saturation was 88 to 90% at home on pulse oximetry.  He said his wife told him he has been confused at times and occasionally stares into space.  He was advised to come to the hospital because of worsening symptoms. In the ED, patient was hypoxic with oxygen saturation going down to 80% on room air according to ED physician, Dr. Darnelle Noble. Pt placed on 2 L/min oxygen via nasal cannula and oxygen saturation has improved into the 90s.  Patient admitted for further management.     Today, patient denies any new complaints, still requiring about 2 L of oxygen, plan to wean off.  Still short of breath especially on ambulation.   Assessment/Plan: Principal Problem:   Pneumonia due to COVID-19 virus Active Problems:   Acute respiratory failure with hypoxia (HCC)   Obesity, Class III, BMI 40-49.9 (morbid obesity) (HCC)   Acute hypoxic respiratory failure likely 2/2 COVID-19 pneumonia Currently requiring about 2 L of O2 saturating above 90% Currently afebrile, with leukocytosis (on steroids) Elevated inflammatory markers, will trend Procalcitonin negative Chest x-ray showed  fairly extensive bilateral interstitial and patchy airspace opacities throughout both lungs CTA chest negative for PE, groundglass opacities throughout the bilateral lungs Continue steroid, remdesivir, cough suppressants, inhalers, vitamins Encourage incentive spirometry, flutter valve Supplemental oxygen as needed, monitor closely   Obesity Lifestyle modification advised       Malnutrition Type:      Malnutrition Characteristics:      Nutrition Interventions:       Estimated body mass index is 39.6 kg/m as calculated from the following:   Height as of this encounter: 6' (1.829 m).   Weight as of this encounter: 132.5 kg.     Code Status: Full  Family Communication: Discussed with wife over the phone on 08/10/2019  Disposition Plan: Status is: Inpatient  Remains inpatient appropriate because:Inpatient level of care appropriate due to severity of illness   Dispo: The patient is from: Home              Anticipated d/c is to: Home              Anticipated d/c date is: 2 days              Patient currently is not medically stable to d/c.    Consultants:  None  Procedures:  None  Antimicrobials:  None  DVT prophylaxis: Lovenox   Objective: Vitals:   08/10/19 1015 08/10/19 1230 08/10/19 1300 08/10/19 1336  BP:   137/70   Pulse:   70   Resp:   20   Temp:   98.3 F (36.8 C)   TempSrc:   Oral  SpO2: 96% 100% 96% 94%  Weight:      Height:        Intake/Output Summary (Last 24 hours) at 08/10/2019 1448 Last data filed at 08/10/2019 1000 Gross per 24 hour  Intake 340 ml  Output --  Net 340 ml   Filed Weights   08/08/19 1413  Weight: 132.5 kg    Exam:  General: NAD   Cardiovascular: S1, S2 present  Respiratory: CTAB  Abdomen: Soft, nontender, nondistended, bowel sounds present  Musculoskeletal: No bilateral pedal edema noted  Skin: Normal  Psychiatry: Normal mood    Data Reviewed: CBC: Recent Labs  Lab 08/08/19 1444  08/09/19 0959 08/10/19 0342  WBC 7.1 6.8 19.4*  NEUTROABS 4.7 5.2 15.3*  HGB 13.3 13.1 13.1  HCT 38.7* 39.3 36.9*  MCV 89.2 89.9 88.1  PLT 196 250 283   Basic Metabolic Panel: Recent Labs  Lab 08/08/19 1444 08/09/19 0959 08/10/19 0342  NA 137 138 136  K 4.3 4.3 4.3  CL 101 101 101  CO2 27 25 26   GLUCOSE 103* 177* 140*  BUN 14 17 20   CREATININE 0.99 0.82 1.02  CALCIUM 8.5* 8.8* 8.8*   GFR: Estimated Creatinine Clearance: 131.6 mL/min (by C-G formula based on SCr of 1.02 mg/dL). Liver Function Tests: Recent Labs  Lab 08/08/19 1444 08/09/19 0959 08/10/19 0342  AST 38 35 33  ALT 36 40 47*  ALKPHOS 38 45 38  BILITOT 0.7 1.0 0.7  PROT 7.2 7.2 6.9  ALBUMIN 3.6 3.5 3.5   No results for input(s): LIPASE, AMYLASE in the last 168 hours. No results for input(s): AMMONIA in the last 168 hours. Coagulation Profile: No results for input(s): INR, PROTIME in the last 168 hours. Cardiac Enzymes: No results for input(s): CKTOTAL, CKMB, CKMBINDEX, TROPONINI in the last 168 hours. BNP (last 3 results) No results for input(s): PROBNP in the last 8760 hours. HbA1C: No results for input(s): HGBA1C in the last 72 hours. CBG: No results for input(s): GLUCAP in the last 168 hours. Lipid Profile: Recent Labs    08/08/19 1444  TRIG 131   Thyroid Function Tests: No results for input(s): TSH, T4TOTAL, FREET4, T3FREE, THYROIDAB in the last 72 hours. Anemia Panel: Recent Labs    08/09/19 0959 08/10/19 0342  FERRITIN 594* 566*   Urine analysis:    Component Value Date/Time   COLORURINE YELLOW 12/09/2016 1040   APPEARANCEUR CLEAR 12/09/2016 1040   LABSPEC 1.019 12/09/2016 1040   PHURINE 6.0 12/09/2016 1040   GLUCOSEU NEGATIVE 12/09/2016 1040   HGBUR LARGE (A) 12/09/2016 1040   BILIRUBINUR NEGATIVE 12/09/2016 1040   KETONESUR NEGATIVE 12/09/2016 1040   PROTEINUR NEGATIVE 12/09/2016 1040   NITRITE NEGATIVE 12/09/2016 1040   LEUKOCYTESUR NEGATIVE 12/09/2016 1040   Sepsis  Labs: @LABRCNTIP (procalcitonin:4,lacticidven:4)  ) Recent Results (from the past 240 hour(s))  SARS Coronavirus 2 by RT PCR (hospital order, performed in [facility] hospital lab) Nasopharyngeal Nasopharyngeal Swab     Status: Abnormal   Collection Time: 08/03/19  6:17 AM   Specimen: Nasopharyngeal Swab  Result Value Ref Range Status   SARS Coronavirus 2 POSITIVE (A) NEGATIVE Final    Comment: RESULT CALLED TO, READ BACK BY AND VERIFIED WITH: RN [phone] 08/03/19 JM (NOTE) SARS-CoV-2 target nucleic acids are DETECTED  SARS-CoV-2 RNA is generally detectable in upper respiratory specimens  during the acute phase of infection.  Positive results are indicative  of the presence of the identified virus, but do not rule out bacterial  infection or co-infection with other pathogens not detected by the test.  Clinical correlation with patient history and  other diagnostic information is necessary to determine patient infection status.  The expected result is negative.  Fact Sheet for Patients:   BoilerBrush.com.cyhttps://www.fda.gov/media/136312/download   Fact Sheet for Healthcare Providers:   https://pope.com/https://www.fda.gov/media/136313/download    This test is not yet approved or cleared by the [place] FDA and  has been authorized for detection and/or diagnosis of SARS-CoV-2 by FDA under an Emergency Use Authorization (EUA).  This EUA will remain in effect (meaning this test can b e used) for the duration of  the COVID-19 declaration under Section 564(b)(1) of the Act, 21 U.S.C. section 360-bbb-3(b)(1), unless the authorization is terminated or revoked sooner.  Performed at [facility], [address] [place], [place] [address]   Blood culture (routine x 2)     Status: None   Collection Time: 08/03/19  6:26 AM   Specimen: BLOOD  Result Value Ref Range Status   Specimen Description   Final    BLOOD RIGHT ANTECUBITAL Performed at [facility], [address] [place], [place] [address]    Special Requests   Final    BOTTLES DRAWN AEROBIC AND ANAEROBIC Blood Culture adequate volume Performed at [facility], [address] [place], [place] [address]    Culture   Final    NO GROWTH 5 DAYS Performed at [facility], [address] [place], [place] [address]    Report Status 08/08/2019 FINAL  Final  Blood Culture (routine x 2)     Status: None (Preliminary result)   Collection Time: 08/08/19  2:39 PM   Specimen: BLOOD  Result Value Ref Range Status   Specimen Description BLOOD RIGHT ANTECUBITAL  Final   Special Requests   Final    BOTTLES DRAWN AEROBIC AND ANAEROBIC Blood Culture adequate volume   Culture   Final    NO GROWTH 2 DAYS Performed at [facility], [address] [place], [place] [address]    Report Status PENDING  Incomplete  Blood Culture (routine x 2)     Status: None (Preliminary result)   Collection Time: 08/08/19  2:44 PM   Specimen: BLOOD  Result Value Ref Range Status   Specimen Description BLOOD LEFT ANTECUBITAL  Final   Special Requests   Final    BOTTLES DRAWN AEROBIC AND ANAEROBIC Blood Culture adequate volume   Culture   Final    NO GROWTH 2 DAYS Performed at [facility], [address] [place], [place] [address]    Report Status PENDING  Incomplete      Studies: CT ANGIO CHEST PE W OR WO CONTRAST  Result Date: 08/09/2019 CLINICAL DATA:  PE suspected, COVID EXAM: CT ANGIOGRAPHY CHEST WITH CONTRAST TECHNIQUE: Multidetector CT imaging of the chest was performed using the standard protocol during bolus administration of intravenous contrast. Multiplanar CT image reconstructions and MIPs were obtained to evaluate the vascular anatomy. CONTRAST:  100mL OMNIPAQUE IOHEXOL 350 MG/ML SOLN COMPARISON:  None. FINDINGS: Cardiovascular: Satisfactory opacification of the pulmonary arteries to the segmental level. No evidence of pulmonary embolism. Normal heart size. No  pericardial effusion. Mediastinum/Nodes: No enlarged mediastinal, hilar, or axillary lymph nodes. Thyroid gland, trachea, and esophagus demonstrate no significant findings. Lungs/Pleura: Trace bilateral pleural effusions pain associated atelectasis or consolidation. Irregular and ground-glass opacity throughout the bilateral lungs. Upper Abdomen: No acute abnormality. Musculoskeletal: No chest wall abnormality. No acute  or significant osseous findings. Review of the MIP images confirms the above findings. IMPRESSION: 1. Negative examination for pulmonary embolism. 2. Irregular and ground-glass opacity throughout the bilateral lungs, consistent with COVID-19 airspace disease. 3. Trace bilateral pleural effusions and associated atelectasis or consolidation. Electronically Signed   By: Lauralyn Primes M.D.   On: 08/09/2019 17:16    Scheduled Meds: . vitamin C  500 mg Oral BID  . dexamethasone (DECADRON) injection  6 mg Intravenous Daily  . enoxaparin (LOVENOX) injection  40 mg Subcutaneous Q24H  . Ipratropium-Albuterol  1 puff Inhalation Q6H  . zinc sulfate  220 mg Oral Daily    Continuous Infusions: . remdesivir 100 mg in NS 100 mL 100 mg (08/10/19 1027)     LOS: 2 days     Briant Cedar, MD [facility]  If 7PM-7AM, please contact night-coverage [email or web address] 08/10/2019, 2:48 PM

## 2019-08-11 LAB — CBC WITH DIFFERENTIAL/PLATELET
Abs Immature Granulocytes: 1.52 10*3/uL — ABNORMAL HIGH (ref 0.00–0.07)
Basophils Absolute: 0.1 10*3/uL (ref 0.0–0.1)
Basophils Relative: 0 %
Eosinophils Absolute: 0 10*3/uL (ref 0.0–0.5)
Eosinophils Relative: 0 %
HCT: 39 % (ref 39.0–52.0)
Hemoglobin: 13.2 g/dL (ref 13.0–17.0)
Immature Granulocytes: 6 %
Lymphocytes Relative: 12 %
Lymphs Abs: 3 10*3/uL (ref 0.7–4.0)
MCH: 30.8 pg (ref 26.0–34.0)
MCHC: 33.8 g/dL (ref 30.0–36.0)
MCV: 91.1 fL (ref 80.0–100.0)
Monocytes Absolute: 1.7 10*3/uL — ABNORMAL HIGH (ref 0.1–1.0)
Monocytes Relative: 7 %
Neutro Abs: 19.8 10*3/uL — ABNORMAL HIGH (ref 1.7–7.7)
Neutrophils Relative %: 75 %
Platelets: 297 10*3/uL (ref 150–400)
RBC: 4.28 MIL/uL (ref 4.22–5.81)
RDW: 13.7 % (ref 11.5–15.5)
Smear Review: NORMAL
WBC: 26.1 10*3/uL — ABNORMAL HIGH (ref 4.0–10.5)
nRBC: 0.1 % (ref 0.0–0.2)

## 2019-08-11 LAB — COMPREHENSIVE METABOLIC PANEL
ALT: 79 U/L — ABNORMAL HIGH (ref 0–44)
AST: 48 U/L — ABNORMAL HIGH (ref 15–41)
Albumin: 3.6 g/dL (ref 3.5–5.0)
Alkaline Phosphatase: 47 U/L (ref 38–126)
Anion gap: 10 (ref 5–15)
BUN: 24 mg/dL — ABNORMAL HIGH (ref 6–20)
CO2: 26 mmol/L (ref 22–32)
Calcium: 8.9 mg/dL (ref 8.9–10.3)
Chloride: 102 mmol/L (ref 98–111)
Creatinine, Ser: 0.91 mg/dL (ref 0.61–1.24)
GFR calc Af Amer: >60 mL/min (ref >60)
GFR calc non Af Amer: >60 mL/min (ref >60)
Glucose, Bld: 128 mg/dL — ABNORMAL HIGH (ref 70–99)
Potassium: 4.3 mmol/L (ref 3.5–5.1)
Sodium: 138 mmol/L (ref 135–145)
Total Bilirubin: 0.7 mg/dL (ref 0.3–1.2)
Total Protein: 6.6 g/dL (ref 6.5–8.1)

## 2019-08-11 LAB — FERRITIN: Ferritin: 569 ng/mL — ABNORMAL HIGH (ref 24–336)

## 2019-08-11 LAB — FIBRIN DERIVATIVES D-DIMER (ARMC ONLY): Fibrin derivatives D-dimer (ARMC): 413.08 ng/mL (FEU) (ref 0.00–499.00)

## 2019-08-11 LAB — C-REACTIVE PROTEIN: CRP: 0.9 mg/dL (ref <1.0)

## 2019-08-11 NOTE — Progress Notes (Signed)
PROGRESS NOTE  Allen Noble UEA:[id number] DOB: 08-Jun-1975 DOA: 08/08/2019 PCP: Jarrett Soho, PA-C  HPI/Recap of past 24 hours: HPI from Dr Elsie Lincoln Staszak is a 44 y.o. male with medical history significant for testosterone deficiency, obesity, presented to the hospital because of increasing shortness of breath and confusion.  He said he was diagnosed with Covid infection about 2 weeks ago.  He has been having intermittent fever and chills at home. He went to [facility] emergency department on 08/03/2019 where he was treated and discharged on the same day.  At that time, he was not hypoxic and he did not have any increased work of breathing per chart review. Pt continues to have productive cough associated with pleuritic chest pain on both sides of the chest and pain radiating down both shoulders.  His oxygen saturation was 88 to 90% at home on pulse oximetry.  He said his wife told him he has been confused at times and occasionally stares into space.  He was advised to come to the hospital because of worsening symptoms. In the ED, patient was hypoxic with oxygen saturation going down to 80% on room air according to ED physician, Dr. Darnelle Catalan. Pt placed on 2 L/min oxygen via nasal cannula and oxygen saturation has improved into the 90s.  Patient admitted for further management.     Today, patient reported feeling better, denied any worsening shortness of breath, cough, denied any chest pain, abdominal pain, nausea/vomiting, fever/chills, diarrhea.   Assessment/Plan: Principal Problem:   Pneumonia due to COVID-19 virus Active Problems:   Acute respiratory failure with hypoxia (HCC)   Obesity, Class III, BMI 40-49.9 (morbid obesity) (HCC)   Acute hypoxic respiratory failure likely 2/2 COVID-19 pneumonia Currently requiring about 2 L of O2 saturating above 90% Currently afebrile, with worsening leukocytosis (??on steroids) Elevated inflammatory markers, will  trend Procalcitonin negative Chest x-ray showed fairly extensive bilateral interstitial and patchy airspace opacities throughout both lungs CTA chest negative for PE, groundglass opacities throughout the bilateral lungs Continue steroid, remdesivir, cough suppressants, inhalers, vitamins Encourage incentive spirometry, flutter valve Supplemental oxygen as needed, monitor closely  Obesity Lifestyle modification advised       Malnutrition Type:      Malnutrition Characteristics:      Nutrition Interventions:       Estimated body mass index is 39.6 kg/m as calculated from the following:   Height as of this encounter: 6' (1.829 m).   Weight as of this encounter: 132.5 kg.     Code Status: Full  Family Communication: Discussed with wife over the phone on 08/10/2019  Disposition Plan: Status is: Inpatient  Remains inpatient appropriate because:Inpatient level of care appropriate due to severity of illness   Dispo: The patient is from: Home              Anticipated d/c is to: Home              Anticipated d/c date is: 1 day              Patient currently is not medically stable to d/c.    Consultants:  None  Procedures:  None  Antimicrobials:  None  DVT prophylaxis: Lovenox   Objective: Vitals:   08/11/19 0007 08/11/19 0517 08/11/19 0832 08/11/19 1239  BP: 118/71 136/88 (!) 141/80 127/76  Pulse: 69 68 77 76  Resp:  18 18 16   Temp: 97.8 F (36.6 C) 98.2 F (36.8 C) 98.5 F (36.9 C) 97.7 F (36.5  C)  TempSrc: Oral Oral Oral Oral  SpO2: 95% 95% 99% 97%  Weight:      Height:       No intake or output data in the 24 hours ending 08/11/19 1406 Filed Weights   08/08/19 1413  Weight: 132.5 kg    Exam:  General: NAD   Cardiovascular: S1, S2 present  Respiratory: CTAB  Abdomen: Soft, nontender, nondistended, bowel sounds present  Musculoskeletal: No bilateral pedal edema noted  Skin: Normal  Psychiatry: Normal mood    Data  Reviewed: CBC: Recent Labs  Lab 08/08/19 1444 08/09/19 0959 08/10/19 0342 08/11/19 0508  WBC 7.1 6.8 19.4* 26.1*  NEUTROABS 4.7 5.2 15.3* 19.8*  HGB 13.3 13.1 13.1 13.2  HCT 38.7* 39.3 36.9* 39.0  MCV 89.2 89.9 88.1 91.1  PLT 196 250 283 297   Basic Metabolic Panel: Recent Labs  Lab 08/08/19 1444 08/09/19 0959 08/10/19 0342 08/11/19 0508  NA 137 138 136 138  K 4.3 4.3 4.3 4.3  CL 101 101 101 102  CO2 27 25 26 26   GLUCOSE 103* 177* 140* 128*  BUN 14 17 20  24*  CREATININE 0.99 0.82 1.02 0.91  CALCIUM 8.5* 8.8* 8.8* 8.9   GFR: Estimated Creatinine Clearance: 147.5 mL/min (by C-G formula based on SCr of 0.91 mg/dL). Liver Function Tests: Recent Labs  Lab 08/08/19 1444 08/09/19 0959 08/10/19 0342 08/11/19 0508  AST 38 35 33 48*  ALT 36 40 47* 79*  ALKPHOS 38 45 38 47  BILITOT 0.7 1.0 0.7 0.7  PROT 7.2 7.2 6.9 6.6  ALBUMIN 3.6 3.5 3.5 3.6   No results for input(s): LIPASE, AMYLASE in the last 168 hours. No results for input(s): AMMONIA in the last 168 hours. Coagulation Profile: No results for input(s): INR, PROTIME in the last 168 hours. Cardiac Enzymes: No results for input(s): CKTOTAL, CKMB, CKMBINDEX, TROPONINI in the last 168 hours. BNP (last 3 results) No results for input(s): PROBNP in the last 8760 hours. HbA1C: No results for input(s): HGBA1C in the last 72 hours. CBG: No results for input(s): GLUCAP in the last 168 hours. Lipid Profile: Recent Labs    08/08/19 1444  TRIG 131   Thyroid Function Tests: No results for input(s): TSH, T4TOTAL, FREET4, T3FREE, THYROIDAB in the last 72 hours. Anemia Panel: Recent Labs    08/10/19 0342 08/11/19 0508  FERRITIN 566* 569*   Urine analysis:    Component Value Date/Time   COLORURINE YELLOW 12/09/2016 1040   APPEARANCEUR CLEAR 12/09/2016 1040   LABSPEC 1.019 12/09/2016 1040   PHURINE 6.0 12/09/2016 1040   GLUCOSEU NEGATIVE 12/09/2016 1040   HGBUR LARGE (A) 12/09/2016 1040   BILIRUBINUR NEGATIVE  12/09/2016 1040   KETONESUR NEGATIVE 12/09/2016 1040   PROTEINUR NEGATIVE 12/09/2016 1040   NITRITE NEGATIVE 12/09/2016 1040   LEUKOCYTESUR NEGATIVE 12/09/2016 1040   Sepsis Labs: @LABRCNTIP (procalcitonin:4,lacticidven:4)  ) Recent Results (from the past 240 hour(s))  SARS Coronavirus 2 by RT PCR (hospital order, performed in [facility] hospital lab) Nasopharyngeal Nasopharyngeal Swab     Status: Abnormal   Collection Time: 08/03/19  6:17 AM   Specimen: Nasopharyngeal Swab  Result Value Ref Range Status   SARS Coronavirus 2 POSITIVE (A) NEGATIVE Final    Comment: RESULT CALLED TO, READ BACK BY AND VERIFIED WITH: RN [phone] 08/03/19 JM (NOTE) SARS-CoV-2 target nucleic acids are DETECTED  SARS-CoV-2 RNA is generally detectable in upper respiratory specimens  during the acute phase of infection.  Positive results are indicative  of the presence of the identified virus, but do not rule out bacterial infection or co-infection with other pathogens not detected by the test.  Clinical correlation with patient history and  other diagnostic information is necessary to determine patient infection status.  The expected result is negative.  Fact Sheet for Patients:   BoilerBrush.com.cy   Fact Sheet for Healthcare Providers:   https://pope.com/    This test is not yet approved or cleared by the [place] FDA and  has been authorized for detection and/or diagnosis of SARS-CoV-2 by FDA under an Emergency Use Authorization (EUA).  This EUA will remain in effect (meaning this test can b e used) for the duration of  the COVID-19 declaration under Section 564(b)(1) of the Act, 21 U.S.C. section 360-bbb-3(b)(1), unless the authorization is terminated or revoked sooner.  Performed at [facility], [address] [place], [place] [address]   Blood culture (routine x 2)     Status: None   Collection Time: 08/03/19   6:26 AM   Specimen: BLOOD  Result Value Ref Range Status   Specimen Description   Final    BLOOD RIGHT ANTECUBITAL Performed at [facility], [address], [place], [place] [address]    Special Requests   Final    BOTTLES DRAWN AEROBIC AND ANAEROBIC Blood Culture adequate volume Performed at [facility], [address], [place], [place] [address]    Culture   Final    NO GROWTH 5 DAYS Performed at [facility], [address], [place], [place] [address]    Report Status 08/08/2019 FINAL  Final  Blood Culture (routine x 2)     Status: None (Preliminary result)   Collection Time: 08/08/19  2:39 PM   Specimen: BLOOD  Result Value Ref Range Status   Specimen Description BLOOD RIGHT ANTECUBITAL  Final   Special Requests   Final    BOTTLES DRAWN AEROBIC AND ANAEROBIC Blood Culture adequate volume   Culture   Final    NO GROWTH 3 DAYS Performed at [facility], [address], [place], [place] [address]    Report Status PENDING  Incomplete  Blood Culture (routine x 2)     Status: None (Preliminary result)   Collection Time: 08/08/19  2:44 PM   Specimen: BLOOD  Result Value Ref Range Status   Specimen Description BLOOD LEFT ANTECUBITAL  Final   Special Requests   Final    BOTTLES DRAWN AEROBIC AND ANAEROBIC Blood Culture adequate volume   Culture   Final    NO GROWTH 3 DAYS Performed at [facility], [address] [place], [place] [address]    Report Status PENDING  Incomplete      Studies: No results found.  Scheduled Meds: . vitamin C  500 mg Oral BID  . dexamethasone (DECADRON) injection  6 mg Intravenous Daily  . enoxaparin (LOVENOX) injection  40 mg Subcutaneous Q24H  . Ipratropium-Albuterol  1 puff Inhalation Q6H  . zinc sulfate  220 mg Oral Daily    Continuous Infusions: . remdesivir 100 mg in NS 100 mL 100 mg (08/11/19 1024)     LOS: 3 days     Briant Cedar, MD [facility]  If 7PM-7AM, please contact night-coverage [email or web address] 08/11/2019, 2:06 PM

## 2019-08-12 LAB — COMPREHENSIVE METABOLIC PANEL
ALT: 73 U/L — ABNORMAL HIGH (ref 0–44)
AST: 33 U/L (ref 15–41)
Albumin: 3.7 g/dL (ref 3.5–5.0)
Alkaline Phosphatase: 45 U/L (ref 38–126)
Anion gap: 10 (ref 5–15)
BUN: 25 mg/dL — ABNORMAL HIGH (ref 6–20)
CO2: 25 mmol/L (ref 22–32)
Calcium: 8.8 mg/dL — ABNORMAL LOW (ref 8.9–10.3)
Chloride: 101 mmol/L (ref 98–111)
Creatinine, Ser: 0.93 mg/dL (ref 0.61–1.24)
GFR calc Af Amer: >60 mL/min (ref >60)
GFR calc non Af Amer: >60 mL/min (ref >60)
Glucose, Bld: 119 mg/dL — ABNORMAL HIGH (ref 70–99)
Potassium: 4.1 mmol/L (ref 3.5–5.1)
Sodium: 136 mmol/L (ref 135–145)
Total Bilirubin: 0.8 mg/dL (ref 0.3–1.2)
Total Protein: 6.6 g/dL (ref 6.5–8.1)

## 2019-08-12 LAB — CBC WITH DIFFERENTIAL/PLATELET
Abs Immature Granulocytes: 2.05 K/uL — ABNORMAL HIGH (ref 0.00–0.07)
Basophils Absolute: 0.2 K/uL — ABNORMAL HIGH (ref 0.0–0.1)
Basophils Relative: 1 %
Eosinophils Absolute: 0 K/uL (ref 0.0–0.5)
Eosinophils Relative: 0 %
HCT: 38.3 % — ABNORMAL LOW (ref 39.0–52.0)
Hemoglobin: 13.3 g/dL (ref 13.0–17.0)
Immature Granulocytes: 9 %
Lymphocytes Relative: 13 %
Lymphs Abs: 3.1 K/uL (ref 0.7–4.0)
MCH: 30.4 pg (ref 26.0–34.0)
MCHC: 34.7 g/dL (ref 30.0–36.0)
MCV: 87.6 fL (ref 80.0–100.0)
Monocytes Absolute: 1.9 K/uL — ABNORMAL HIGH (ref 0.1–1.0)
Monocytes Relative: 8 %
Neutro Abs: 16.4 K/uL — ABNORMAL HIGH (ref 1.7–7.7)
Neutrophils Relative %: 69 %
Platelets: 302 K/uL (ref 150–400)
RBC: 4.37 MIL/uL (ref 4.22–5.81)
RDW: 13.5 % (ref 11.5–15.5)
Smear Review: NORMAL
WBC: 23.7 K/uL — ABNORMAL HIGH (ref 4.0–10.5)
nRBC: 0.3 % — ABNORMAL HIGH (ref 0.0–0.2)

## 2019-08-12 LAB — FIBRIN DERIVATIVES D-DIMER (ARMC ONLY): Fibrin derivatives D-dimer (ARMC): 299.65 ng/mL (FEU) (ref 0.00–499.00)

## 2019-08-12 LAB — FERRITIN: Ferritin: 445 ng/mL — ABNORMAL HIGH (ref 24–336)

## 2019-08-12 LAB — C-REACTIVE PROTEIN: CRP: 0.6 mg/dL (ref <1.0)

## 2019-08-12 LAB — PROCALCITONIN: Procalcitonin: <0.1 ng/mL

## 2019-08-12 MED ORDER — ZINC SULFATE 220 (50 ZN) MG PO CAPS: 220.0000 mg | ORAL_CAPSULE | Freq: Every day | ORAL | 0 refills | Status: AC

## 2019-08-12 MED ORDER — IPRATROPIUM-ALBUTEROL 20-100 MCG/ACT IN AERS: 1.0000 | INHALATION_SPRAY | Freq: Four times a day (QID) | RESPIRATORY_TRACT | 0 refills | Status: DC

## 2019-08-12 MED ORDER — DEXAMETHASONE 6 MG PO TABS
6.0000 mg | ORAL_TABLET | Freq: Every day | ORAL | 0 refills | Status: AC
Start: 2019-08-12 — End: 2019-08-18

## 2019-08-12 MED ORDER — VITAMIN C 1000 MG PO TABS
1000.0000 mg | ORAL_TABLET | Freq: Every day | ORAL | 0 refills | Status: AC
Start: 2019-08-12 — End: 2019-09-11

## 2019-08-12 NOTE — Progress Notes (Signed)
Patient discharged home via POV. After Visit Summary reviewed with patient and patient had no questions.

## 2019-08-12 NOTE — Discharge Summary (Signed)
Discharge Summary  Allen Noble:[id number] DOB: 07-12-1975  PCP: Jarrett Soho, PA-C  Admit date: 08/08/2019 Discharge date: 08/12/2019  Time spent: 40 mins  Recommendations for Outpatient Follow-up:  1. PCP follow up   Discharge Diagnoses:  Active Hospital Problems   Diagnosis Date Noted  . Pneumonia due to COVID-19 virus 08/08/2019  . Acute respiratory failure with hypoxia (HCC) 08/08/2019  . Obesity, Class III, BMI 40-49.9 (morbid obesity) (HCC) 08/08/2019    Resolved Hospital Problems  No resolved problems to display.    Discharge Condition: Stable  Diet recommendation: Heart healthy  Vitals:   08/12/19 0503 08/12/19 0822  BP: 118/81 125/82  Pulse: 71 69  Resp:  18  Temp: 98 F (36.7 C) 98 F (36.7 C)  SpO2: 95% 99%    History of present illness:  Allen Allisonis a 44 y.o.malewith medical history significant for testosterone deficiency, obesity, presented to the hospital because of increasing shortness of breath and confusion. He said he was diagnosed with Covid infection about 2 weeks ago. He has been having intermittent fever and chills at home. [facility] he was treated and discharged on the same day. At that time, he was not hypoxic and he did not have any increased work of breathing per chart review. Pt continues to have productive cough associated with pleuritic chest pain on both sides of the chest and pain radiating down both shoulders. His oxygen saturation was 88 to 90% at home on pulse oximetry. He said his wife told him he has been confused at times and occasionally stares into space. He was advised to come to the hospital because of worsening symptoms. In the ED, patient was hypoxic with oxygen saturation going down to 80% on room air according to ED physician, Dr. Darnelle Catalan. Pt placed on 2 L/min oxygen via nasal cannula and oxygen saturation has improved into the 90s.  Patient  admitted for further management.    Today, pt reports feeling better, denies any worsening shortness of breath, denies any chest pain, abdominal pain, nausea/vomiting, fever/chills, diarrhea.  Discussed with patient about the need to complete his steroids, use his incentive spirometry/flutter valve frequently, and mobilize frequently.  Discussed with wife over the phone, who stated they are thinking about getting the Covid vaccine.    Hospital Course:  Principal Problem:   Pneumonia due to COVID-19 virus Active Problems:   Acute respiratory failure with hypoxia (HCC)   Obesity, Class III, BMI 40-49.9 (morbid obesity) (HCC)   Acute hypoxic respiratory failure likely 2/2 COVID-19 pneumonia Currently saturating well on room air, weaned off O2 Currently afebrile, with leukocytosis (on steroids) Elevated inflammatory markers, trended down Procalcitonin negative Chest x-ray showed fairly extensive bilateral interstitial and patchy airspace opacities throughout both lungs CTA chest negative for PE, groundglass opacities throughout the bilateral lungs Completed remdesivir Continue steroid to complete 10 days, cough suppressants prn, inhalers, vitamins Encourage incentive spirometry, flutter valve  Obesity Lifestyle modification advised          Malnutrition Type:      Malnutrition Characteristics:      Nutrition Interventions:      Estimated body mass index is 39.6 kg/m as calculated from the following:   Height as of this encounter: 6' (1.829 m).   Weight as of this encounter: 132.5 kg.    Procedures:  None  Consultations:  None    Discharge Exam: BP 125/82 (BP Location: Left Arm)   Pulse 69   Temp 98 F (36.7  C) (Oral)   Resp 18   Ht 6' (1.829 m)   Wt 132.5 kg   SpO2 99%   BMI 39.60 kg/m   General: NAD  Cardiovascular: S1, S2 presents Respiratory: CTAB   Discharge Instructions You were cared for by a hospitalist during your hospital stay.  If you have any questions about your discharge medications or the care you received while you were in the hospital after you are discharged, you can call the unit and asked to speak with the hospitalist on call if the hospitalist that took care of you is not available. Once you are discharged, your primary care physician will handle any further medical issues. Please note that NO REFILLS for any discharge medications will be authorized once you are discharged, as it is imperative that you return to your primary care physician (or establish a relationship with a primary care physician if you do not have one) for your aftercare needs so that they can reassess your need for medications and monitor your lab values.  Discharge Instructions    Diet - low sodium heart healthy   Complete by: As directed    Increase activity slowly   Complete by: As directed    MyChart COVID-19 home monitoring program   Complete by: Aug 12, 2019    Is the patient willing to use the MyChart Mobile App for home monitoring?: Yes   Temperature monitoring   Complete by: Aug 12, 2019    After how many days would you like to receive a notification of this patient's flowsheet entries?: 1     Allergies as of 08/12/2019   No Known Allergies     Medication List    TAKE these medications   albuterol 108 (90 Base) MCG/ACT inhaler Commonly known as: VENTOLIN HFA Inhale 1-2 puffs into the lungs every 4 (four) hours as needed for shortness of breath or wheezing.   benzonatate 100 MG capsule Commonly known as: TESSALON Take 100 mg by mouth 3 (three) times daily as needed for cough.   [employer] Chest Congestion Relief 400 MG Tabs tablet Generic drug: guaifenesin Take 400 mg by mouth 2 (two) times daily as needed for congestion.   dexamethasone 6 MG tablet Commonly known as: DECADRON Take 1 tablet (6 mg total) by mouth daily for 6 days.   Ipratropium-Albuterol 20-100 MCG/ACT Aers respimat Commonly known as: COMBIVENT Inhale 1  puff into the lungs every 6 (six) hours.   ondansetron 4 MG disintegrating tablet Commonly known as: Zofran ODT Take 1 tablet (4 mg total) by mouth every 8 (eight) hours as needed for nausea or vomiting.   Testosterone 20.25 MG/ACT (1.62%) Gel Apply 2 Pump topically See admin instructions. Apply 1 pump to each upper arm topically every day   vitamin C 1000 MG tablet Take 1 tablet (1,000 mg total) by mouth daily.   zinc sulfate 220 (50 Zn) MG capsule Take 1 capsule (220 mg total) by mouth daily.      No Known Allergies  Follow-up Information    Jarrett Soho, [place]. Schedule an appointment as soon as possible for a visit in 1 week(s).   Specialty: Family Medicine Contact information: [address] [place] [address] [phone]                The results of significant diagnostics from this hospitalization (including imaging, microbiology, ancillary and laboratory) are listed below for reference.    Significant Diagnostic Studies: CT ANGIO CHEST PE W OR WO CONTRAST  Result Date: 08/09/2019 CLINICAL DATA:  PE suspected, COVID EXAM: CT ANGIOGRAPHY CHEST WITH CONTRAST TECHNIQUE: Multidetector CT imaging of the chest was performed using the standard protocol during bolus administration of intravenous contrast. Multiplanar CT image reconstructions and MIPs were obtained to evaluate the vascular anatomy. CONTRAST:  100mL OMNIPAQUE IOHEXOL 350 MG/ML SOLN COMPARISON:  None. FINDINGS: Cardiovascular: Satisfactory opacification of the pulmonary arteries to the segmental level. No evidence of pulmonary embolism. Normal heart size. No pericardial effusion. Mediastinum/Nodes: No enlarged mediastinal, hilar, or axillary lymph nodes. Thyroid gland, trachea, and esophagus demonstrate no significant findings. Lungs/Pleura: Trace bilateral pleural effusions pain associated atelectasis or consolidation. Irregular and ground-glass opacity throughout the bilateral lungs. Upper Abdomen:  No acute abnormality. Musculoskeletal: No chest wall abnormality. No acute or significant osseous findings. Review of the MIP images confirms the above findings. IMPRESSION: 1. Negative examination for pulmonary embolism. 2. Irregular and ground-glass opacity throughout the bilateral lungs, consistent with COVID-19 airspace disease. 3. Trace bilateral pleural effusions and associated atelectasis or consolidation. Electronically Signed   By: Lauralyn PrimesAlex  Bibbey M.D.   On: 08/09/2019 17:16   DG Chest Port 1 View  Result Date: 08/08/2019 CLINICAL DATA:  COVID. Additional history provided: Patient diagnosed with COVID 2 weeks ago, increased shortness of breath, confusion, oxygen saturations of 89%, dizziness, cough. EXAM: PORTABLE CHEST 1 VIEW COMPARISON:  Prior chest radiograph 08/03/2019. FINDINGS: Heart size within normal limits. New from the prior examination of 08/03/2019 there are fairly extensive bilateral interstitial and patchy airspace opacities throughout both lungs. No evidence of pleural effusion or pneumothorax. No acute bony abnormality identified. IMPRESSION: Fairly extensive bilateral interstitial and patchy airspace opacities throughout both lungs, new as compared to the prior examination of 08/03/2019. Findings are consistent with COVID pneumonia given the provided history. Electronically Signed   By: Jackey LogeKyle  Golden DO   On: 08/08/2019 14:46   DG Chest Portable 1 View  Result Date: 08/03/2019 CLINICAL DATA:  Patient states he passed out last night. He tested positive for covid with home test on Tuesday. Mother died of covin on July 22, 2019. Patient states that he can not shake the fever. Patient took tylenol around 4 pm. EXAM: PORTABLE CHEST 1 VIEW COMPARISON:  None. FINDINGS: Cardiac silhouette is normal in size. No mediastinal or hilar masses. No evidence of adenopathy. Lung volumes are low.  Lungs are clear. No convincing pleural effusion or pneumothorax. Skeletal structures are grossly intact.  IMPRESSION: No active disease. Electronically Signed   By: Amie Portlandavid  Ormond M.D.   On: 08/03/2019 07:14    Microbiology: Recent Results (from the past 240 hour(s))  SARS Coronavirus 2 by RT PCR (hospital order, performed in [facility] lab) Nasopharyngeal Nasopharyngeal Swab     Status: Abnormal   Collection Time: 08/03/19  6:17 AM   Specimen: Nasopharyngeal Swab  Result Value Ref Range Status   SARS Coronavirus 2 POSITIVE (A) NEGATIVE Final    Comment: RESULT CALLED TO, READ BACK BY AND VERIFIED WITH: Lanette HampshireCLAPP, S RN [phone] 08/03/19 JM (NOTE) SARS-CoV-2 target nucleic acids are DETECTED  SARS-CoV-2 RNA is generally detectable in upper respiratory specimens  during the acute phase of infection.  Positive results are indicative  of the presence of the identified virus, but do not rule out bacterial infection or co-infection with other pathogens not detected by the test.  Clinical correlation with patient history and  other diagnostic information is necessary to determine patient infection status.  The expected result is negative.  Fact Sheet for Patients:  BoilerBrush.com.cy   Fact Sheet for Healthcare Providers:   https://pope.com/    This test is not yet approved or cleared by the [place] FDA and  has been authorized for detection and/or diagnosis of SARS-CoV-2 by FDA under an Emergency Use Authorization (EUA).  This EUA will remain in effect (meaning this test can b e used) for the duration of  the COVID-19 declaration under Section 564(b)(1) of the Act, 21 U.S.C. section 360-bbb-3(b)(1), unless the authorization is terminated or revoked sooner.  Performed at [facility], [address], [place], [place] [address]   Blood culture (routine x 2)     Status: None   Collection Time: 08/03/19  6:26 AM   Specimen: BLOOD  Result Value Ref Range Status   Specimen Description   Final    BLOOD RIGHT  ANTECUBITAL Performed at [facility], [address], [place], [place] [address]    Special Requests   Final    BOTTLES DRAWN AEROBIC AND ANAEROBIC Blood Culture adequate volume Performed at [facility], [address], [place], [place] [address]    Culture   Final    NO GROWTH 5 DAYS Performed at [facility], [address] [place], [place] [address]    Report Status 08/08/2019 FINAL  Final  Blood Culture (routine x 2)     Status: None (Preliminary result)   Collection Time: 08/08/19  2:39 PM   Specimen: BLOOD  Result Value Ref Range Status   Specimen Description BLOOD RIGHT ANTECUBITAL  Final   Special Requests   Final    BOTTLES DRAWN AEROBIC AND ANAEROBIC Blood Culture adequate volume   Culture   Final    NO GROWTH 4 DAYS Performed at [facility], [address] [place], [place] [address]    Report Status PENDING  Incomplete  Blood Culture (routine x 2)     Status: None (Preliminary result)   Collection Time: 08/08/19  2:44 PM   Specimen: BLOOD  Result Value Ref Range Status   Specimen Description BLOOD LEFT ANTECUBITAL  Final   Special Requests   Final    BOTTLES DRAWN AEROBIC AND ANAEROBIC Blood Culture adequate volume   Culture   Final    NO GROWTH 4 DAYS Performed at [facility], [address] [place], [place] [address]    Report Status PENDING  Incomplete     Labs: Basic Metabolic Panel: Recent Labs  Lab 08/08/19 1444 08/09/19 0959 08/10/19 0342 08/11/19 0508 08/12/19 0422  NA 137 138 136 138 136  K 4.3 4.3 4.3 4.3 4.1  CL 101 101 101 102 101  CO2 GLUCOSE 103* 177* 140* 128* 119*  BUN 24* 25*  CREATININE 0.99 0.82 1.02 0.91 0.93  CALCIUM 8.5* 8.8* 8.8* 8.9 8.8*   Liver Function Tests: Recent Labs  Lab 08/08/19 1444 08/09/19 0959 08/10/19 0342 08/11/19 0508 08/12/19 0422  AST 38 35 33 48* 33  ALT 36 40 47* 79* 73*  ALKPHOS 38 45 38 47 45  BILITOT  0.7 1.0 0.7 0.7 0.8  PROT 7.2 7.2 6.9 6.6 6.6  ALBUMIN 3.6 3.5 3.5 3.6 3.7   No results for input(s): LIPASE, AMYLASE in the last 168 hours. No results for input(s): AMMONIA in the last 168 hours. CBC: Recent Labs  Lab 08/08/19 1444 08/09/19 0959 08/10/19 0342 08/11/19 0508 08/12/19 0422  WBC 7.1 6.8 19.4* 26.1* 23.7*  NEUTROABS  4.7 5.2 15.3* 19.8* 16.4*  HGB 13.3 13.1 13.1 13.2 13.3  HCT 38.7* 39.3 36.9* 39.0 38.3*  MCV 89.2 89.9 88.1 91.1 87.6  PLT 196 250 283 297 302   Cardiac Enzymes: No results for input(s): CKTOTAL, CKMB, CKMBINDEX, TROPONINI in the last 168 hours. BNP: BNP (last 3 results) No results for input(s): BNP in the last 8760 hours.  ProBNP (last 3 results) No results for input(s): PROBNP in the last 8760 hours.  CBG: No results for input(s): GLUCAP in the last 168 hours.     Signed:  Briant Cedar, MD [facility] 08/12/2019, 11:00 AM

## 2019-08-12 NOTE — Progress Notes (Signed)
Per Dr Ezenduka d/c tele monitor 

## 2019-08-12 NOTE — Plan of Care (Signed)

## 2019-08-13 LAB — CULTURE, BLOOD (ROUTINE X 2)
Culture: NO GROWTH
Culture: NO GROWTH
Special Requests: ADEQUATE
Special Requests: ADEQUATE

## 2019-08-23 ENCOUNTER — Other Ambulatory Visit: Payer: Self-pay | Admitting: Internal Medicine

## 2019-08-23 DIAGNOSIS — J1282 Pneumonia due to coronavirus disease 2019: Secondary | ICD-10-CM

## 2020-04-27 ENCOUNTER — Encounter: Payer: Self-pay | Admitting: Pulmonary Disease

## 2020-04-27 ENCOUNTER — Ambulatory Visit (INDEPENDENT_AMBULATORY_CARE_PROVIDER_SITE_OTHER): Payer: BC Managed Care – PPO | Admitting: Pulmonary Disease

## 2020-04-27 ENCOUNTER — Other Ambulatory Visit: Payer: Self-pay

## 2020-04-27 VITALS — BP 142/84 | HR 83 | Temp 97.4°F | Ht 72.0 in | Wt 286.0 lb

## 2020-04-27 DIAGNOSIS — Z8616 Personal history of COVID-19: Secondary | ICD-10-CM

## 2020-04-27 DIAGNOSIS — G4733 Obstructive sleep apnea (adult) (pediatric): Secondary | ICD-10-CM | POA: Diagnosis not present

## 2020-04-27 DIAGNOSIS — R0602 Shortness of breath: Secondary | ICD-10-CM

## 2020-04-27 MED ORDER — ALBUTEROL SULFATE HFA 108 (90 BASE) MCG/ACT IN AERS: 2.0000 | INHALATION_SPRAY | Freq: Four times a day (QID) | RESPIRATORY_TRACT | 6 refills | Status: DC | PRN

## 2020-04-27 MED ORDER — BREO ELLIPTA 100-25 MCG/INH IN AEPB: 1.0000 | INHALATION_SPRAY | Freq: Every day | RESPIRATORY_TRACT | 0 refills | Status: DC

## 2020-04-27 NOTE — Progress Notes (Signed)
Synopsis: Referred in April 2022 for shortness of breath  Subjective:   PATIENT ID: Allen Noble GENDER: male DOB: 1975/07/20, MRN: [id number]   HPI  Chief Complaint  Patient presents with  . Consult    Self referral for history of COVID and SOB. Was diagnosed with COVID back in August 2021. Has had SOB with exertion ever since.    Allen Noble is a 45 year old male, never smoker with testosterone deficiency and obesity who is referred to pulmonary clinic for shortness of breath since Covid 19 infection in 08/2019.   He was admitted to [facility] 08/09/19 to 08/12/19 for respiratory failure due to Covid 19 pneumonia. He was treated with remdesivir and dexamethasone. He completed a 10 day course of steroids at home. 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   He does report some intermittent wheezing per the wife. He does have chest tightness and pressure intermittently. He does snore at night. He reports losing 20lbs with a notable reduction in his snoring.   He is following with a GI doctor due to fatty liver disease. An alpha 1 anti-trypsin level was checked and within normal limits at 89 and MM phenotype.   His father died at 70 from necrotizing pancreatitis. His mother died at 39 from Covid, but had many underlying health conditions. His paternal grandfather died at 71, he had lung cancer.   Past Medical History:  Diagnosis Date  . Testosterone deficiency      No family history on file.   Social History   Socioeconomic History  . Marital status: Married    Spouse name: Not on file  . Number of children: Not on file  . Years of education: Not on file  . Highest education level: Not on file  Occupational History  . Not on file  Tobacco Use  . Smoking status: Never Smoker  . Smokeless tobacco: Never Used  Substance and Sexual Activity  . Alcohol use: No  . Drug use: No  . Sexual activity: Not  on file  Other Topics Concern  . Not on file  Social History Narrative  . Not on file   Social Determinants of Health   Financial Resource Strain: Not on file  Food Insecurity: Not on file  Transportation Needs: Not on file  Physical Activity: Not on file  Stress: Not on file  Social Connections: Not on file  Intimate Partner Violence: Not on file     Allergies  Allergen Reactions  . Prednisone Other (See Comments)    Per patient and wife, causes hallucinations      Outpatient Medications Prior to Visit  Medication Sig Dispense Refill  . Testosterone 20.25 MG/ACT (1.62%) GEL Apply 2 Pump topically See admin instructions. Apply 1 pump to each upper arm topically every day  3  . albuterol (VENTOLIN HFA) 108 (90 Base) MCG/ACT inhaler Inhale 1-2 puffs into the lungs every 4 (four) hours as needed for shortness of breath or wheezing.    . benzonatate (TESSALON) 100 MG capsule Take 100 mg by mouth 3 (three) times daily as needed for cough.    . [employer] CHEST CONGESTION RELIEF 400 MG TABS tablet Take 400 mg by mouth 2 (two) times daily as needed for congestion.    . Ipratropium-Albuterol (COMBIVENT) 20-100 MCG/ACT AERS respimat Inhale 1 puff into the lungs every 6 (six) hours. 4 g 0  . ondansetron (ZOFRAN ODT) 4 MG  disintegrating tablet Take 1 tablet (4 mg total) by mouth every 8 (eight) hours as needed for nausea or vomiting. 20 tablet 0   No facility-administered medications prior to visit.    Review of Systems  Constitutional: Negative for chills, fever, malaise/fatigue and weight loss.  HENT: Negative for congestion, sinus pain and sore throat.   Eyes: Negative.   Respiratory: Positive for shortness of breath and wheezing. Negative for cough, hemoptysis and sputum production.   Cardiovascular: Negative for chest pain, palpitations, orthopnea, claudication and leg swelling.  Gastrointestinal: Negative for abdominal pain, heartburn, nausea and vomiting.  Genitourinary: Negative.    Musculoskeletal: Negative for joint pain and myalgias.  Skin: Negative for rash.  Neurological: Negative for weakness.  Endo/Heme/Allergies: Negative.   Psychiatric/Behavioral: Negative.     Objective:   Vitals:   04/27/20 1447  BP: (!) 142/84  Pulse: 83  Temp: (!) 97.4 F (36.3 C)  TempSrc: Temporal  SpO2: 98%  Weight: 286 lb (129.7 kg)  Height: 6' (1.829 m)     Physical Exam Constitutional:      General: He is not in acute distress. HENT:     Head: Normocephalic and atraumatic.  Eyes:     Extraocular Movements: Extraocular movements intact.     Conjunctiva/sclera: Conjunctivae normal.     Pupils: Pupils are equal, round, and reactive to light.  Cardiovascular:     Rate and Rhythm: Normal rate and regular rhythm.     Pulses: Normal pulses.     Heart sounds: Normal heart sounds. No murmur heard.   Pulmonary:     Effort: Pulmonary effort is normal.     Breath sounds: No wheezing, rhonchi or rales.  Abdominal:     General: Bowel sounds are normal.     Palpations: Abdomen is soft.  Musculoskeletal:     Right lower leg: No edema.     Left lower leg: No edema.  Lymphadenopathy:     Cervical: No cervical adenopathy.  Skin:    General: Skin is warm and dry.  Neurological:     General: No focal deficit present.     Mental Status: He is alert.  Psychiatric:        Mood and Affect: Mood normal.        Behavior: Behavior normal.        Thought Content: Thought content normal.        Judgment: Judgment normal.     CBC    Component Value Date/Time   WBC 23.7 (H) 08/12/2019 0422   RBC 4.37 08/12/2019 0422   HGB 13.3 08/12/2019 0422   HCT 38.3 (L) 08/12/2019 0422   PLT 302 08/12/2019 0422   MCV 87.6 08/12/2019 0422   MCH 30.4 08/12/2019 0422   MCHC 34.7 08/12/2019 0422   RDW 13.5 08/12/2019 0422   LYMPHSABS 3.1 08/12/2019 0422   MONOABS 1.9 (H) 08/12/2019 0422   EOSABS 0.0 08/12/2019 0422   BASOSABS 0.2 (H) 08/12/2019 0422   BMP Latest Ref Rng & Units  08/12/2019 08/11/2019 08/10/2019  Glucose 70 - 99 mg/dL 401(U) 272(Z) 366(Y)  BUN 6 - 20 mg/dL 40(H) 47(Q) 20  Creatinine 0.61 - 1.24 mg/dL 2.59 5.63 8.75  Sodium 135 - 145 mmol/L 136 138 136  Potassium 3.5 - 5.1 mmol/L 4.1 4.3 4.3  Chloride 98 - 111 mmol/L 101 102 101  CO2 22 - 32 mmol/L 25 26 26   Calcium 8.9 - 10.3 mg/dL ) 8.9 6.4(P)   Chest imaging: CTA Chest 08/09/2019 1.  Negative examination for pulmonary embolism. 2. Irregular and ground-glass opacity throughout the bilateral lungs, consistent with COVID-19 airspace disease. 3. Trace bilateral pleural effusions and associated atelectasis or Consolidation.  PFT: No flowsheet data found.  Sleep Study 2018 - Mild OSA, AHI 6 and REM AHI 35.4  Assessment & Plan:   Shortness of breath - Plan: Pulmonary Function Test, albuterol (VENTOLIN HFA) 108 (90 Base) MCG/ACT inhaler  Mild obstructive sleep apnea  History of COVID-19  Discussion: Allen Noble is a 45 year old male, never smoker with testosterone deficiency and obesity who is referred to pulmonary clinic for shortness of breath since Covid 19 infection in 08/2019.   We will check pulmonary function tests to further evaluate his dyspnea.   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 He may also have also have post-viral reactive airways disease. He also has history of mild obstructive sleep apnea that could be contributing to his shortness of breath.   He is to start a sample of breo ellipta and if he notices improvement in his breathing he can call us and we will send in a prescription.   We discussed the importance of weight loss and recommended he continue to lose more weight with a goal around 250lbs.   Based on his pulmonary function tests we will consider a follow up CT chest.  Follow up in 3 months.  Greater than 60 minutes was spent on this visit which included record review, direct contact with the patient, document and  order completion.   Melody Comas, MD [facility] Office: [phone]    Current Outpatient Medications:  .  albuterol (VENTOLIN HFA) 108 (90 Base) MCG/ACT inhaler, Inhale 2 puffs into the lungs every 6 (six) hours as needed for wheezing or shortness of breath., Disp: 8 g, Rfl: 6 .  fluticasone furoate-vilanterol (BREO ELLIPTA) 100-25 MCG/INH AEPB, Inhale 1 puff into the lungs daily., Disp: 1 each, Rfl: 0 .  Testosterone 20.25 MG/ACT (1.62%) GEL, Apply 2 Pump topically See admin instructions. Apply 1 pump to each upper arm topically every day, Disp: , Rfl: 3

## 2020-04-27 NOTE — Patient Instructions (Addendum)
Start breo ellipta 1 puff daily - rinse mouth out after each use  Use albuterol as needed 1-2 puffs every 4-6 hours for shortness of breath, wheezing, chest tightness or cough.  We will schedule you for pulmonary function tests and call you with the results.   Work on weight loss and we will consider repeat sleep study in the future if needed.

## 2020-05-07 ENCOUNTER — Encounter: Payer: Self-pay | Admitting: Pulmonary Disease

## 2020-05-11 ENCOUNTER — Institutional Professional Consult (permissible substitution): Payer: BC Managed Care – PPO | Admitting: Pulmonary Disease

## 2020-05-21 ENCOUNTER — Other Ambulatory Visit (HOSPITAL_COMMUNITY)
Admission: RE | Admit: 2020-05-21 | Discharge: 2020-05-21 | Disposition: A | Discharge status: Home / Self Care | Payer: BC Managed Care – PPO | Source: Ambulatory Visit | Attending: Pulmonary Disease | Admitting: Pulmonary Disease

## 2020-05-21 DIAGNOSIS — Z01812 Encounter for preprocedural laboratory examination: Secondary | ICD-10-CM | POA: Insufficient documentation

## 2020-05-21 DIAGNOSIS — Z20822 Contact with and (suspected) exposure to covid-19: Secondary | ICD-10-CM | POA: Insufficient documentation

## 2020-05-21 LAB — SARS CORONAVIRUS 2 (TAT 6-24 HRS): SARS Coronavirus 2: NEGATIVE

## 2020-05-25 ENCOUNTER — Other Ambulatory Visit: Payer: Self-pay

## 2020-05-25 ENCOUNTER — Ambulatory Visit: Payer: BC Managed Care – PPO | Admitting: Pulmonary Disease

## 2020-05-25 ENCOUNTER — Ambulatory Visit (INDEPENDENT_AMBULATORY_CARE_PROVIDER_SITE_OTHER): Payer: BC Managed Care – PPO | Admitting: Pulmonary Disease

## 2020-05-25 ENCOUNTER — Encounter: Payer: Self-pay | Admitting: Pulmonary Disease

## 2020-05-25 VITALS — BP 128/86 | HR 86 | Temp 98.1°F | Ht 72.5 in | Wt 301.0 lb

## 2020-05-25 DIAGNOSIS — R0602 Shortness of breath: Secondary | ICD-10-CM

## 2020-05-25 DIAGNOSIS — G4733 Obstructive sleep apnea (adult) (pediatric): Secondary | ICD-10-CM | POA: Diagnosis not present

## 2020-05-25 DIAGNOSIS — Z8616 Personal history of COVID-19: Secondary | ICD-10-CM

## 2020-05-25 DIAGNOSIS — R942 Abnormal results of pulmonary function studies: Secondary | ICD-10-CM | POA: Diagnosis not present

## 2020-05-25 LAB — PULMONARY FUNCTION TEST
DL/VA % pred: 114 %
DL/VA: 5.45 ml/min/mmHg/L
DLCO cor % pred: 102 %
DLCO cor: 36.07 ml/min/mmHg
DLCO unc % pred: 102 %
DLCO unc: 36.07 ml/min/mmHg
FEF 25-75 Post: 5.53 L/sec
FEF 25-75 Pre: 5.14 L/sec
FEF2575-%Change-Post: 7 %
FEF2575-%Pred-Post: 134 %
FEF2575-%Pred-Pre: 125 %
FEV1-%Change-Post: 2 %
FEV1-%Pred-Post: 97 %
FEV1-%Pred-Pre: 95 %
FEV1-Post: 4.27 L
FEV1-Pre: 4.17 L
FEV1FVC-%Change-Post: 1 %
FEV1FVC-%Pred-Pre: 106 %
FEV6-%Change-Post: 1 %
FEV6-%Pred-Post: 91 %
FEV6-%Pred-Pre: 90 %
FEV6-Post: 4.96 L
FEV6-Pre: 4.89 L
FEV6FVC-%Pred-Post: 102 %
FEV6FVC-%Pred-Pre: 102 %
FVC-%Change-Post: 1 %
FVC-%Pred-Post: 89 %
FVC-%Pred-Pre: 88 %
FVC-Post: 4.96 L
FVC-Pre: 4.89 L
Post FEV1/FVC ratio: 86 %
Post FEV6/FVC ratio: 100 %
Pre FEV1/FVC ratio: 85 %
Pre FEV6/FVC Ratio: 100 %
RV % pred: 34 %
RV: 0.69 L
TLC % pred: 77 %
TLC: 5.72 L

## 2020-05-25 NOTE — Patient Instructions (Addendum)
We will schedule you for a home sleep study and CT Chest scan.   You have a mild restrictive defect on your pulmonary function tests that could be related to residual damage from the covid pneumonia.   Continue as needed albuterol use.   We will schedule you for a home sleep study.   Work on weight loss in the future with reducing your sweet tea and fast food intake along with adding 20-30 minute walks 4-5 times per week.   We will schedule follow up based on the CT scan and sleep study.

## 2020-05-25 NOTE — Patient Instructions (Signed)
Full PFT performed today. °

## 2020-05-25 NOTE — Progress Notes (Signed)
Synopsis: Referred in April 2022 for shortness of breath  Subjective:   PATIENT ID: Rosalio Loud GENDER: male DOB: [birth year], MRN: [id number]  HPI  Chief Complaint  Patient presents with  . Follow-up    F/U after PFT. States he has noticed that he has been breathing more from his mouth lately. He does not feel like he is able to get enough air from his nose. Denies having any nasal obstructions.    Ina Poupard is a 45 year old male, never smoker with testosterone deficiency and obesity who returns to pulmonary clinic for shortness of breath since Covid 19 infection in 08/2019.   He had pulmonary function tests which show mild restrictive defect. He did not notice benefit from the [facility] sample. He has used albuterol at times with some relief of his dyspnea.   He continues to have exertional dyspnea. Denies wheezing, cough or sputum production. He reports when he is short of breath he is not breathing through his nose but does not note significant congestion.   He has gained 15lbs since last visit.   Sleep study in 2018 showed mild obstructive sleep apnea with an overall AHI of 6. AHI in REM sleep was 35.4 and 12.2 in supine position.   Past Medical History:  Diagnosis Date  . Testosterone deficiency      No family history on file.   Social History   Socioeconomic History  . Marital status: Married    Spouse name: Not on file  . Number of children: Not on file  . Years of education: Not on file  . Highest education level: Not on file  Occupational History  . Not on file  Tobacco Use  . Smoking status: Never Smoker  . Smokeless tobacco: Never Used  Substance and Sexual Activity  . Alcohol use: No  . Drug use: No  . Sexual activity: Not on file  Other Topics Concern  . Not on file  Social History Narrative  . Not on file   Social Determinants of Health   Financial Resource Strain: Not on file  Food Insecurity: Not on file  Transportation Needs: Not on file   Physical Activity: Not on file  Stress: Not on file  Social Connections: Not on file  Intimate Partner Violence: Not on file     Allergies  Allergen Reactions  . Prednisone Other (See Comments)    Per patient and wife, causes hallucinations      Outpatient Medications Prior to Visit  Medication Sig Dispense Refill  . albuterol (VENTOLIN HFA) 108 (90 Base) MCG/ACT inhaler Inhale 2 puffs into the lungs every 6 (six) hours as needed for wheezing or shortness of breath. 8 g 6  . Testosterone 20.25 MG/ACT (1.62%) GEL Apply 2 Pump topically See admin instructions. Apply 1 pump to each upper arm topically every day  3  . fluticasone furoate-vilanterol (BREO ELLIPTA) 100-25 MCG/INH AEPB Inhale 1 puff into the lungs daily. 1 each 0   No facility-administered medications prior to visit.    Review of Systems  Constitutional: Negative for chills, fever, malaise/fatigue and weight loss.  HENT: Negative for congestion, sinus pain and sore throat.   Eyes: Negative.   Respiratory: Positive for shortness of breath. Negative for cough, hemoptysis, sputum production and wheezing.   Cardiovascular: Negative for chest pain, palpitations, orthopnea, claudication and leg swelling.  Gastrointestinal: Negative for abdominal pain, heartburn, nausea and vomiting.  Genitourinary: Negative.   Musculoskeletal: Negative for joint pain and myalgias.  Skin:  Negative for rash.  Neurological: Negative for weakness.  Endo/Heme/Allergies: Negative.   Psychiatric/Behavioral: Negative.     Objective:   Vitals:   05/25/20 1502  BP: 128/86  Pulse: 86  Temp: 98.1 F (36.7 C)  TempSrc: Temporal  SpO2: 95%  Weight: (!) 301 lb (136.5 kg)  Height: 6' 0.5" (1.842 m)     Physical Exam Constitutional:      General: He is not in acute distress.    Appearance: Normal appearance. He is obese. He is not ill-appearing.  HENT:     Head: Normocephalic and atraumatic.     Nose: Nose normal.     Mouth/Throat:      Mouth: Mucous membranes are moist.     Pharynx: Oropharynx is clear.  Eyes:     Extraocular Movements: Extraocular movements intact.     Conjunctiva/sclera: Conjunctivae normal.     Pupils: Pupils are equal, round, and reactive to light.  Cardiovascular:     Rate and Rhythm: Normal rate and regular rhythm.     Pulses: Normal pulses.     Heart sounds: Normal heart sounds. No murmur heard.   Pulmonary:     Effort: Pulmonary effort is normal.     Breath sounds: No wheezing, rhonchi or rales.  Musculoskeletal:     Right lower leg: No edema.     Left lower leg: No edema.  Lymphadenopathy:     Cervical: No cervical adenopathy.  Skin:    General: Skin is warm and dry.  Neurological:     General: No focal deficit present.     Mental Status: He is alert.  Psychiatric:        Mood and Affect: Mood normal.        Behavior: Behavior normal.        Thought Content: Thought content normal.        Judgment: Judgment normal.     CBC    Component Value Date/Time   WBC 23.7 (H) 08/12/2019 0422   RBC 4.37 08/12/2019 0422   HGB 13.3 08/12/2019 0422   HCT 38.3 (L) 08/12/2019 0422   PLT 302 08/12/2019 0422   MCV 87.6 08/12/2019 0422   MCH 30.4 08/12/2019 0422   MCHC 34.7 08/12/2019 0422   RDW 13.5 08/12/2019 0422   LYMPHSABS 3.1 08/12/2019 0422   MONOABS 1.9 (H) 08/12/2019 0422   EOSABS 0.0 08/12/2019 0422   BASOSABS 0.2 (H) 08/12/2019 0422   BMP Latest Ref Rng & Units 08/12/2019 08/11/2019 08/10/2019  Glucose 70 - 99 mg/dL 409(W) 119(J) 478(G)  BUN 6 - 20 mg/dL 95(A) 21(H) 20  Creatinine 0.61 - 1.24 mg/dL 0.86 5.78 4.69  Sodium 135 - 145 mmol/L 136 138 136  Potassium 3.5 - 5.1 mmol/L 4.1 4.3 4.3  Chloride 98 - 111 mmol/L 101 102 101  CO2 22 - 32 mmol/L 25 26 26   Calcium 8.9 - 10.3 mg/dL ) 8.9 6.2(X)   Chest imaging: CTA Chest 08/09/2019 1. Negative examination for pulmonary embolism. 2. Irregular and ground-glass opacity throughout the bilateral lungs, consistent with  COVID-19 airspace disease. 3. Trace bilateral pleural effusions and associated atelectasis or Consolidation.  PFT: PFT Results Latest Ref Rng & Units 05/25/2020  FVC-Pre L 4.89  FVC-Predicted Pre % 88  FVC-Post L 4.96  FVC-Predicted Post % 89  Pre FEV1/FVC % % 85  Post FEV1/FCV % % 86  FEV1-Pre L 4.17  FEV1-Predicted Pre % 95  FEV1-Post L 4.27  DLCO uncorrected ml/min/mmHg 36.07  DLCO UNC% %  102  DLCO corrected ml/min/mmHg 36.07  DLCO COR %Predicted % 102  DLVA Predicted % 114  TLC L 5.72  TLC % Predicted % 77  RV % Predicted % 34  PFT 2022: Mild restrictive defect  Sleep Study 2018 - Mild OSA, AHI 6 and REM AHI 35.4  Assessment & Plan:   Shortness of breath - Plan: CT Chest High Resolution  History of COVID-19  Restrictive ventilatory defect  Obstructive sleep apnea syndrome - Plan: Home sleep test  Discussion: Sara Selvidge is a 45 year old male, never smoker with testosterone deficiency and obesity who returns to pulmonary clinic for shortness of breath since Covid 19 infection in 08/2019.   His shortness of breath is likely multifactorial in the setting of mild restrictive defect from covid 19, obesity with weight gain and underlying sleep apnea.   We will check a HRCT chest to monitor for resolution of his pulmonary infiltrates from August 2021. We will schedule him for home sleep study.   Recommend weight loss with diet modification and increase in physical activity.   Based on his CT and sleep study results we will determine further follow up in the future.  Melody Comas, MD [facility] Office: [phone]    Current Outpatient Medications:  .  albuterol (VENTOLIN HFA) 108 (90 Base) MCG/ACT inhaler, Inhale 2 puffs into the lungs every 6 (six) hours as needed for wheezing or shortness of breath., Disp: 8 g, Rfl: 6 .  Testosterone 20.25 MG/ACT (1.62%) GEL, Apply 2 Pump topically See admin instructions. Apply 1 pump to each upper  arm topically every day, Disp: , Rfl: 3

## 2020-05-25 NOTE — Progress Notes (Signed)
Full PFT performed today. °

## 2020-06-03 ENCOUNTER — Other Ambulatory Visit: Payer: Self-pay

## 2020-06-03 ENCOUNTER — Ambulatory Visit (INDEPENDENT_AMBULATORY_CARE_PROVIDER_SITE_OTHER)
Admission: RE | Admit: 2020-06-03 | Discharge: 2020-06-03 | Disposition: A | Discharge status: Home / Self Care | Payer: BC Managed Care – PPO | Source: Ambulatory Visit | Attending: Pulmonary Disease | Admitting: Pulmonary Disease

## 2020-06-03 DIAGNOSIS — R0602 Shortness of breath: Secondary | ICD-10-CM | POA: Diagnosis not present

## 2020-06-28 ENCOUNTER — Other Ambulatory Visit: Payer: Self-pay

## 2020-06-28 ENCOUNTER — Ambulatory Visit: Payer: BC Managed Care – PPO

## 2020-06-28 DIAGNOSIS — G4733 Obstructive sleep apnea (adult) (pediatric): Secondary | ICD-10-CM | POA: Diagnosis not present

## 2020-07-06 DIAGNOSIS — G4733 Obstructive sleep apnea (adult) (pediatric): Secondary | ICD-10-CM | POA: Diagnosis not present

## 2020-08-04 ENCOUNTER — Telehealth: Payer: Self-pay | Admitting: Pulmonary Disease

## 2020-08-04 NOTE — Telephone Encounter (Signed)
Received a message from Dr. Francine Graven stating that the patient's HST showed severe OSA and he needs to establish with one of our sleep doctors for management.   I called the patient but he did not answer and his VM was full. I will attempt to call him again.

## 2021-09-19 ENCOUNTER — Ambulatory Visit: Payer: BC Managed Care – PPO | Admitting: Podiatry

## 2021-09-19 ENCOUNTER — Encounter: Payer: Self-pay | Admitting: Podiatry

## 2021-09-19 DIAGNOSIS — B070 Plantar wart: Secondary | ICD-10-CM

## 2021-09-19 NOTE — Progress Notes (Signed)
  Subjective:  Patient ID: Allen Noble, male    DOB: 1975/09/10,  MRN: [id number] HPI Chief Complaint  Patient presents with   Foot Pain    Plantar forefoot right - callused lesion x couple months, tried pumice stone, trimmed, PCP sent here, also macerated area between 4th/5th toes right    New Patient (Initial Visit)    46 y.o. male presents with the above complaint.   ROS: Denies fever chills nausea vomiting muscle aches pains calf pain back pain chest pain shortness of breath.  Past Medical History:  Diagnosis Date   Testosterone deficiency    Past Surgical History:  Procedure Laterality Date   none      Current Outpatient Medications:    Testosterone 20.25 MG/ACT (1.62%) GEL, Apply 2 Pump topically See admin instructions. Apply 1 pump to each upper arm topically every day, Disp: , Rfl: 3  Allergies  Allergen Reactions   Prednisone Other (See Comments)    Per patient and wife, causes hallucinations    Review of Systems Objective:  There were no vitals filed for this visit.  General: Well developed, nourished, in no acute distress, alert and oriented x3   Dermatological: Skin is warm, dry and supple bilateral. Nails x 10 are well maintained; remaining integument appears unremarkable at this time. There are no open sores, no preulcerative lesions, no rash or signs of infection present.  Small benign lesion at the base of the fourth toe right foot and the Sub fifth met head right foot.  He also has a macerated area that does not appear to be infectious in the fourth interdigital space right foot with a deep sulcus.  There is no open wounds or lesions.  The small lesion that is most painful to him is at the base of his fourth toe and initially appears to be a wart however once debrided it debrided very most likely poor keratoma no thrombosed capillaries are visible and the skin lines do not circumvent the lesion.  I debrided all of the benign lesions today.  Vascular:  Dorsalis Pedis artery and Posterior Tibial artery pedal pulses are 2/4 bilateral with immedate capillary fill time. Pedal hair growth present. No varicosities and no lower extremity edema present bilateral.   Neruologic: Grossly intact via light touch bilateral. Vibratory intact via tuning fork bilateral. Protective threshold with Semmes Wienstein monofilament intact to all pedal sites bilateral. Patellar and Achilles deep tendon reflexes 2+ bilateral. No Babinski or clonus noted bilateral.   Musculoskeletal: No gross boney pedal deformities bilateral. No pain, crepitus, or limitation noted with foot and ankle range of motion bilateral. Muscular strength 5/5 in all groups tested bilateral.  Gait: Unassisted, Nonantalgic.    Radiographs:  None taken  Assessment & Plan:   Assessment: Benign skin lesion base of the fourth toe and Sub fifth metatarsal head.  Macerated webspace  Plan: Recommended that he apply alcohol to the webspace to help keep it dry.  I did place Cantharone under occlusion today to help with the deep debridement that I had performed to removed the remainder of any deep hyperkeratotic tissue.  He is on leave this until tomorrow morning washed off thoroughly follow-up with me should this recur may need to consider curettage.     [employer], [place]

## 2022-01-24 ENCOUNTER — Other Ambulatory Visit: Payer: Self-pay

## 2022-01-24 MED ORDER — WEGOVY 0.5 MG/0.5ML ~~LOC~~ SOAJ
0.5000 mg | SUBCUTANEOUS | 0 refills | Status: AC
  Filled 2022-01-24: qty 2, 30d supply, fill #0

## 2022-01-24 MED ORDER — WEGOVY 0.25 MG/0.5ML ~~LOC~~ SOAJ
0.2500 mg | SUBCUTANEOUS | 0 refills | Status: AC
  Filled 2022-01-24: qty 2, 30d supply, fill #0
  Filled 2022-01-24 (×2): qty 2, 28d supply, fill #0

## 2022-01-24 MED ORDER — WEGOVY 1 MG/0.5ML ~~LOC~~ SOAJ
1.0000 mg | SUBCUTANEOUS | 0 refills | Status: AC
  Filled 2022-01-24: qty 2, 30d supply, fill #0

## 2023-05-23 IMAGING — CT CT CHEST HIGH RESOLUTION W/O CM
2 of 7 series · 14 of 36 positions shown, 17 images · non-contrast
Comparison: Chest CTA 08/09/2019.

CLINICAL DATA: 44-year-old male with history of shortness of breath
following COVID infection and 7376.

EXAM:
CT CHEST WITHOUT CONTRAST
TECHNIQUE: Multidetector CT imaging of the chest was performed following the
standard protocol without intravenous contrast. High resolution
imaging of the lungs, as well as inspiratory and expiratory imaging,
was performed.

[Series 4: high resolution · axial · 0.81mm/px · z∈[-348,-78]mm · 11 of 324 slices shown, 14 images]
[im 27/324  mediastinal]
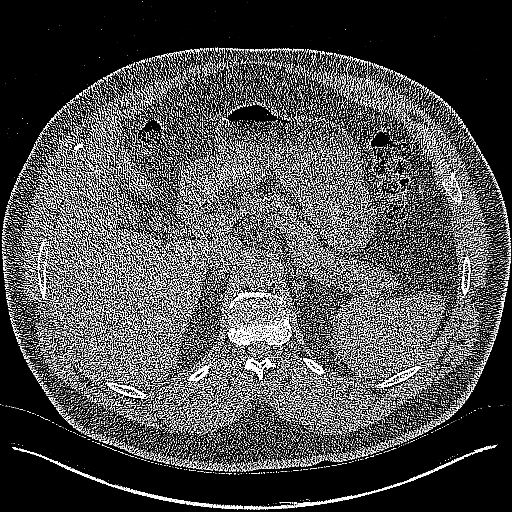
[im 27/324  lung]
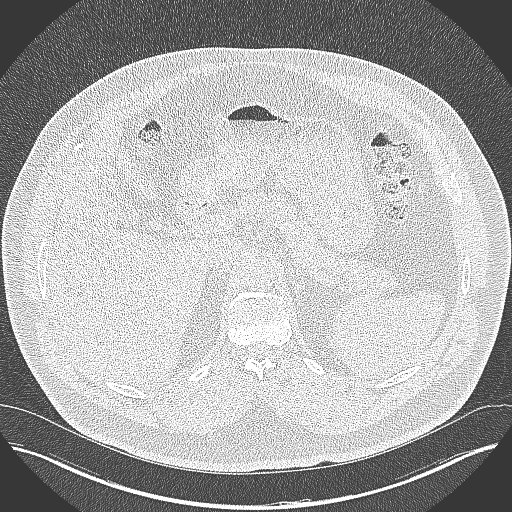
[im 54/324  lung]
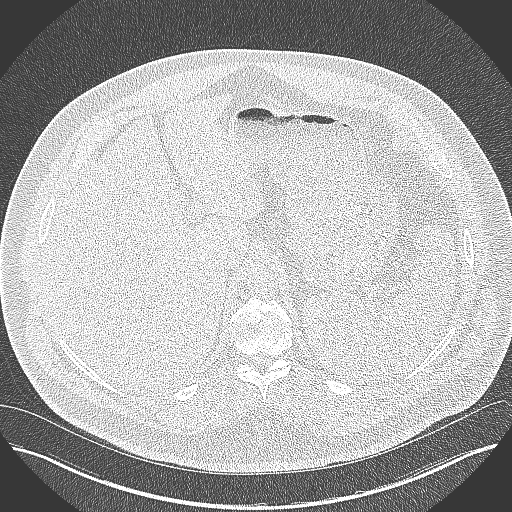
[im 81/324  lung]
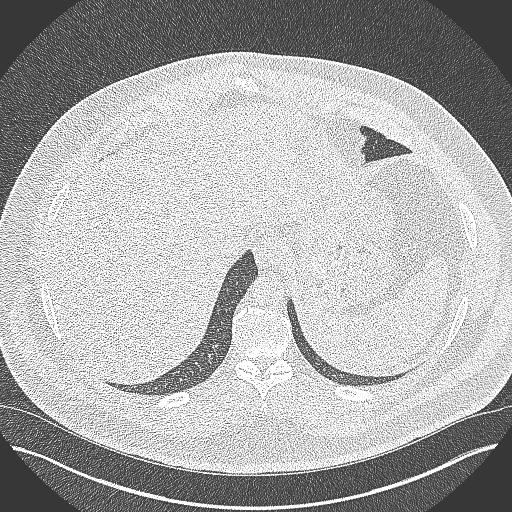
[im 108/324  lung]
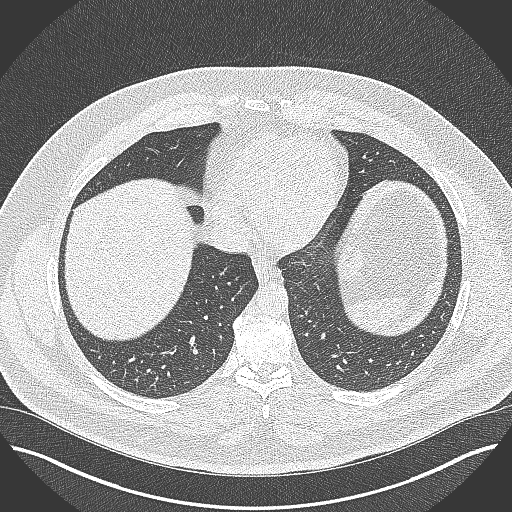
[im 135/324  mediastinal]
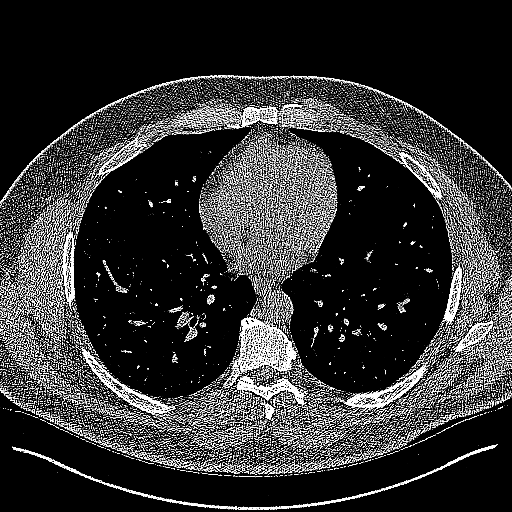
[im 135/324  lung]
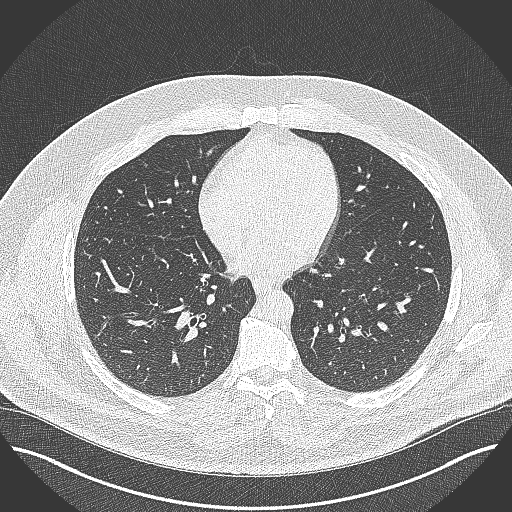
[im 162/324  lung]
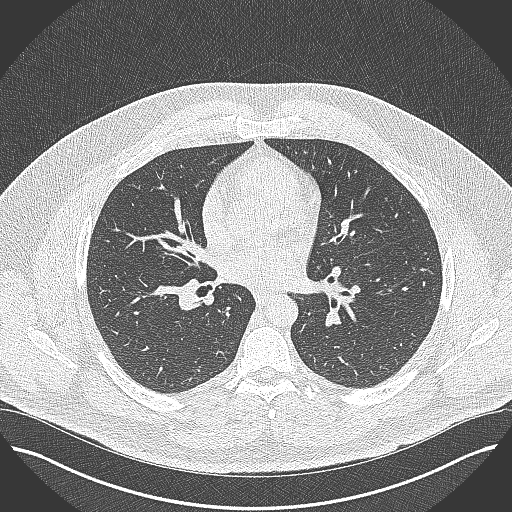
[im 189/324  lung]
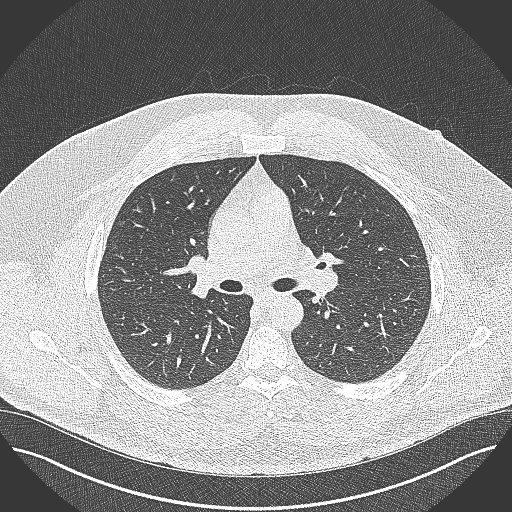
[im 216/324  lung]
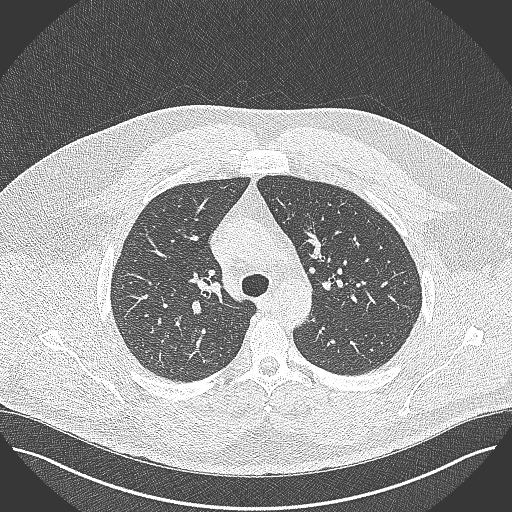
[im 243/324  mediastinal]
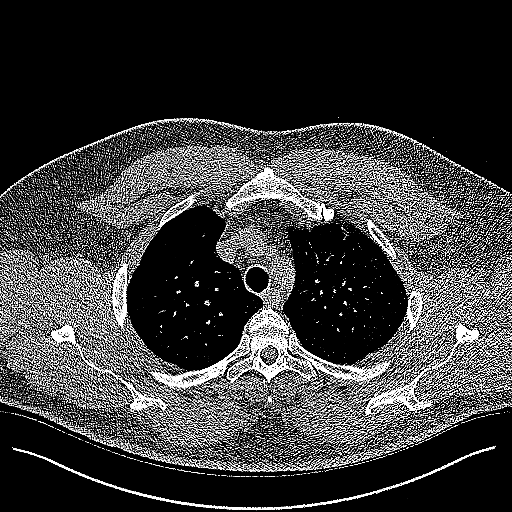
[im 243/324  lung]
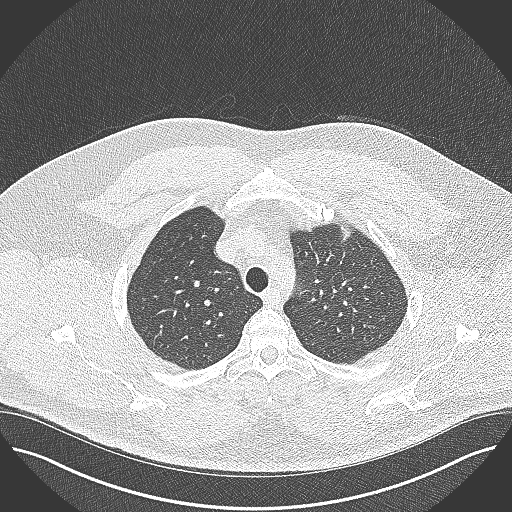
[im 270/324  lung]
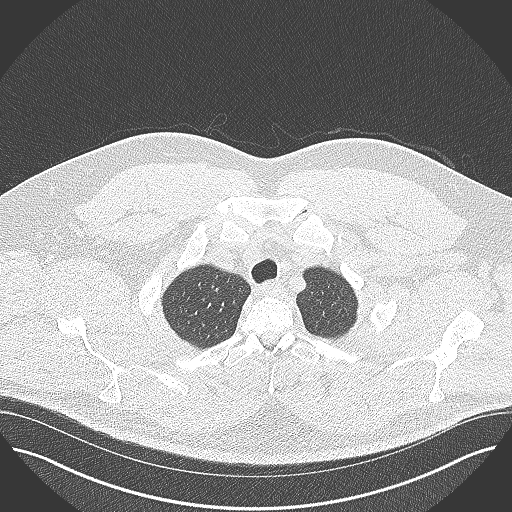
[im 297/324  lung]
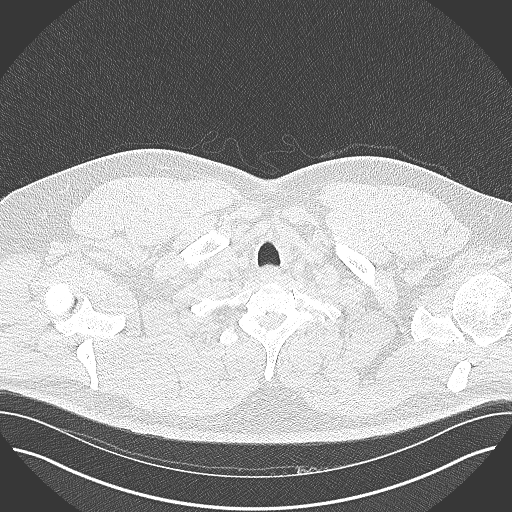

[Series 6: coronal · coronal · 0.64mm/px · 3 of 141 slices shown]
[im 29/141  lung]
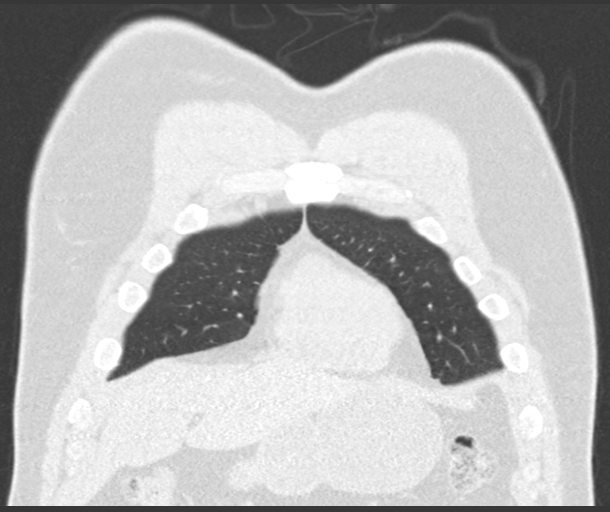
[im 57/141  lung]
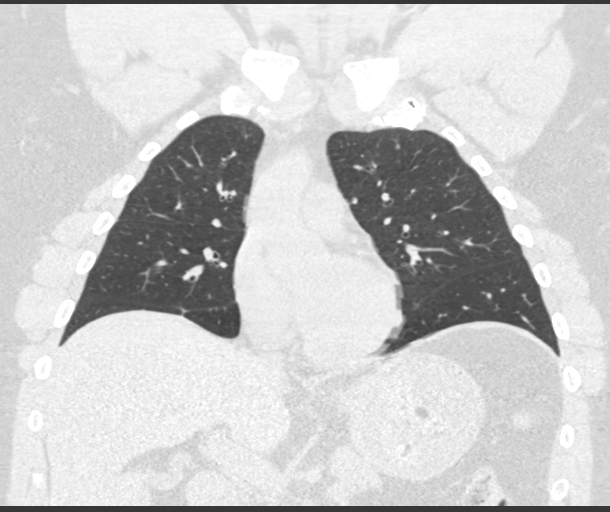
[im 85/141  lung]
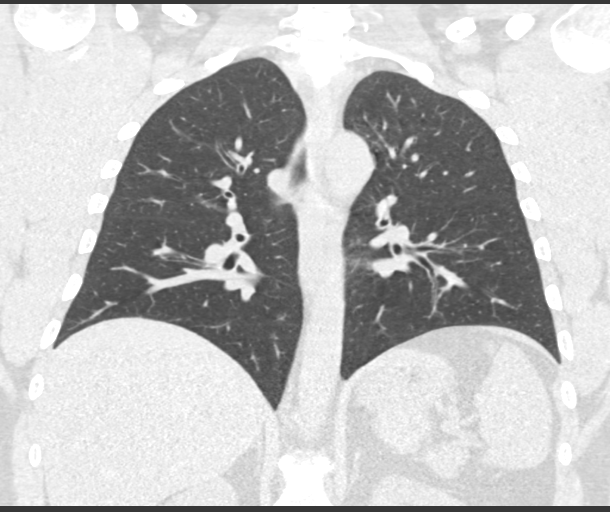

[14 of 36 positions shown; findings below may reference images not displayed]

FINDINGS: Cardiovascular: Heart size is normal. There is no significant
pericardial fluid, thickening or pericardial calcification. No
atherosclerotic calcifications are noted in the thoracic aorta or
the coronary arteries.

Mediastinum/Nodes: No pathologically enlarged mediastinal or hilar
lymph nodes. Please note that accurate exclusion of hilar adenopathy
is limited on noncontrast CT scans. Esophagus is unremarkable in
appearance. No axillary lymphadenopathy.

Lungs/Pleura: High-resolution images demonstrate no significant
regions of ground-glass attenuation, septal thickening, subpleural
reticulation, parenchymal banding, traction bronchiectasis or
honeycombing. Inspiratory and expiratory imaging is unremarkable.
Small calcified granuloma in the left upper lobe. No other
suspicious appearing pulmonary nodules or masses are noted. No acute
consolidative airspace disease. No pleural effusions.

Upper Abdomen: 3 mm nonobstructive calculus in the upper pole
collecting system of the left kidney.

Musculoskeletal: There are no aggressive appearing lytic or blastic
lesions noted in the visualized portions of the skeleton.
IMPRESSION: 1. No findings to suggest interstitial lung disease. No acute
findings in the thorax to account for the patient's symptoms.
2. 3 mm nonobstructive calculus in the upper pole collecting system
of left kidney.
